# Patient Record
Sex: Male | Born: 1970 | Race: White | Hispanic: No | Marital: Married | Smoking: Current every day smoker
Health system: Southern US, Community
[De-identification: ages and names within clinical notes are randomized; demographics above are authoritative.]

## PROBLEM LIST (undated history)

## (undated) DIAGNOSIS — D649 Anemia, unspecified: Secondary | ICD-10-CM

## (undated) DIAGNOSIS — I82409 Acute embolism and thrombosis of unspecified deep veins of unspecified lower extremity: Secondary | ICD-10-CM

## (undated) DIAGNOSIS — E079 Disorder of thyroid, unspecified: Secondary | ICD-10-CM

## (undated) DIAGNOSIS — M1990 Unspecified osteoarthritis, unspecified site: Secondary | ICD-10-CM

## (undated) DIAGNOSIS — M26629 Arthralgia of temporomandibular joint, unspecified side: Secondary | ICD-10-CM

## (undated) HISTORY — PX: BILATERAL TEMPOROMANDIBULAR JOINT ARTHROPLASTY: SUR77

## (undated) HISTORY — PX: ARTHROSCOPIC REPAIR ACL: SUR80

## (undated) HISTORY — DX: Disorder of thyroid, unspecified: E07.9

## (undated) HISTORY — DX: Unspecified osteoarthritis, unspecified site: M19.90

---

## 2005-11-11 ENCOUNTER — Emergency Department (HOSPITAL_COMMUNITY): Admission: EM | Admit: 2005-11-11 | Discharge: 2005-11-11 | Payer: Self-pay | Admitting: Emergency Medicine

## 2007-03-13 ENCOUNTER — Emergency Department (HOSPITAL_COMMUNITY): Admission: EM | Admit: 2007-03-13 | Discharge: 2007-03-13 | Payer: Self-pay | Admitting: Emergency Medicine

## 2007-10-10 ENCOUNTER — Emergency Department (HOSPITAL_COMMUNITY): Admission: EM | Admit: 2007-10-10 | Discharge: 2007-10-10 | Payer: Self-pay | Admitting: Family Medicine

## 2008-05-06 ENCOUNTER — Emergency Department (HOSPITAL_COMMUNITY): Admission: EM | Admit: 2008-05-06 | Discharge: 2008-05-06 | Payer: Self-pay | Admitting: Emergency Medicine

## 2009-09-14 ENCOUNTER — Emergency Department (HOSPITAL_COMMUNITY): Admission: EM | Admit: 2009-09-14 | Discharge: 2009-09-14 | Payer: Self-pay | Admitting: Family Medicine

## 2009-09-18 ENCOUNTER — Observation Stay (HOSPITAL_COMMUNITY): Admission: EM | Admit: 2009-09-18 | Discharge: 2009-09-18 | Payer: Self-pay | Admitting: Emergency Medicine

## 2010-02-25 ENCOUNTER — Emergency Department (HOSPITAL_COMMUNITY): Admission: EM | Admit: 2010-02-25 | Discharge: 2010-02-25 | Payer: Self-pay | Admitting: Family Medicine

## 2010-08-12 ENCOUNTER — Emergency Department (HOSPITAL_COMMUNITY)
Admission: EM | Admit: 2010-08-12 | Discharge: 2010-08-12 | Payer: Self-pay | Source: Home / Self Care | Admitting: Family Medicine

## 2010-09-07 ENCOUNTER — Other Ambulatory Visit (HOSPITAL_COMMUNITY): Payer: Self-pay | Admitting: Orthopaedic Surgery

## 2010-09-07 DIAGNOSIS — S83519A Sprain of anterior cruciate ligament of unspecified knee, initial encounter: Secondary | ICD-10-CM

## 2010-09-07 DIAGNOSIS — M25561 Pain in right knee: Secondary | ICD-10-CM

## 2010-09-09 ENCOUNTER — Other Ambulatory Visit (HOSPITAL_COMMUNITY): Payer: Self-pay

## 2010-09-13 ENCOUNTER — Ambulatory Visit (HOSPITAL_COMMUNITY)
Admission: RE | Admit: 2010-09-13 | Discharge: 2010-09-13 | Disposition: A | Discharge status: Home / Self Care | Payer: 59 | Source: Ambulatory Visit | Attending: Orthopaedic Surgery | Admitting: Orthopaedic Surgery

## 2010-09-13 DIAGNOSIS — M2350 Chronic instability of knee, unspecified knee: Secondary | ICD-10-CM | POA: Insufficient documentation

## 2010-09-13 DIAGNOSIS — M1610 Unilateral primary osteoarthritis, unspecified hip: Secondary | ICD-10-CM | POA: Insufficient documentation

## 2010-09-13 DIAGNOSIS — M23329 Other meniscus derangements, posterior horn of medial meniscus, unspecified knee: Secondary | ICD-10-CM | POA: Insufficient documentation

## 2010-09-13 DIAGNOSIS — S83519A Sprain of anterior cruciate ligament of unspecified knee, initial encounter: Secondary | ICD-10-CM

## 2010-09-13 DIAGNOSIS — M169 Osteoarthritis of hip, unspecified: Secondary | ICD-10-CM | POA: Insufficient documentation

## 2010-09-13 DIAGNOSIS — M25561 Pain in right knee: Secondary | ICD-10-CM

## 2010-09-13 DIAGNOSIS — M25569 Pain in unspecified knee: Secondary | ICD-10-CM | POA: Insufficient documentation

## 2011-06-27 ENCOUNTER — Encounter: Payer: Self-pay | Admitting: Emergency Medicine

## 2011-06-27 ENCOUNTER — Emergency Department (INDEPENDENT_AMBULATORY_CARE_PROVIDER_SITE_OTHER)
Admission: EM | Admit: 2011-06-27 | Discharge: 2011-06-27 | Disposition: A | Discharge status: Home / Self Care | Payer: 59 | Source: Home / Self Care | Attending: Family Medicine | Admitting: Family Medicine

## 2011-06-27 DIAGNOSIS — J209 Acute bronchitis, unspecified: Secondary | ICD-10-CM

## 2011-06-27 MED ORDER — CEFUROXIME AXETIL 250 MG PO TABS: 250.0000 mg | ORAL_TABLET | Freq: Two times a day (BID) | ORAL | Status: AC

## 2011-06-27 NOTE — ED Notes (Signed)
Pt here with uri sx that started x 1 wk ago but has worsened with increased coughing spells with greenish phlegm,nasal and chest congestion and body aches.pt has tried otc cough meds but no relief.denies fevers

## 2011-06-27 NOTE — ED Provider Notes (Signed)
History     CSN: [id number] Arrival date & time: 06/27/2011  1:22 PM   First MD Initiated Contact with Patient 06/27/11 1306      Chief Complaint  Patient presents with  . URI  . Cough    (Consider location/radiation/quality/duration/timing/severity/associated sxs/prior treatment) Patient is a 40 y.o. male presenting with URI and cough. The history is provided by the patient.  URI The primary symptoms include cough and wheezing. Primary symptoms do not include fever, sore throat, nausea, vomiting or rash. The current episode started 6 to 7 days ago. This is a recurrent (smoker) problem. The problem has been gradually worsening.  Symptoms associated with the illness include congestion and rhinorrhea.  Cough Associated symptoms include rhinorrhea and wheezing. Pertinent negatives include no sore throat.    History reviewed. No pertinent past medical history.  History reviewed. No pertinent past surgical history.  History reviewed. No pertinent family history.  History  Substance Use Topics  . Smoking status: Current Everyday Smoker  . Smokeless tobacco: Not on file  . Alcohol Use: Yes      Review of Systems  Constitutional: Negative for fever.  HENT: Positive for congestion, rhinorrhea and postnasal drip. Negative for sore throat.   Respiratory: Positive for cough and wheezing.   Gastrointestinal: Negative for nausea and vomiting.  Skin: Negative for rash.    Allergies  Review of patient's allergies indicates no known allergies.  Home Medications  No current outpatient prescriptions on file.  BP 128/71  Pulse 99  Temp(Src) 98.2 F (36.8 C) (Oral)  Resp 16  SpO2 99%  Physical Exam  Nursing note and vitals reviewed. Constitutional: He appears well-developed and well-nourished.  HENT:  Head: Normocephalic.  Right Ear: External ear normal.  Left Ear: External ear normal.  Mouth/Throat: Oropharynx is clear and moist.  Eyes: Conjunctivae are normal. Pupils  are equal, round, and reactive to light.  Neck: Normal range of motion. Neck supple.  Cardiovascular: Normal rate, normal heart sounds and intact distal pulses.   Pulmonary/Chest: Effort normal and breath sounds normal. He has no wheezes. He has no rales.  Skin: Skin is warm and dry.    ED Course  Procedures (including critical care time)  Labs Reviewed - No data to display No results found.   1. Bronchitis, acute       MDM          Barkley Bruns, MD 06/27/11 [phone]

## 2011-08-28 ENCOUNTER — Encounter (HOSPITAL_COMMUNITY): Payer: Self-pay | Admitting: *Deleted

## 2011-08-28 ENCOUNTER — Emergency Department (INDEPENDENT_AMBULATORY_CARE_PROVIDER_SITE_OTHER)
Admission: EM | Admit: 2011-08-28 | Discharge: 2011-08-28 | Disposition: A | Discharge status: Home / Self Care | Payer: 59 | Source: Home / Self Care | Attending: Family Medicine | Admitting: Family Medicine

## 2011-08-28 ENCOUNTER — Emergency Department (INDEPENDENT_AMBULATORY_CARE_PROVIDER_SITE_OTHER): Payer: 59

## 2011-08-28 DIAGNOSIS — S92919A Unspecified fracture of unspecified toe(s), initial encounter for closed fracture: Secondary | ICD-10-CM

## 2011-08-28 DIAGNOSIS — S92911A Unspecified fracture of right toe(s), initial encounter for closed fracture: Secondary | ICD-10-CM

## 2011-08-28 HISTORY — DX: Arthralgia of temporomandibular joint, unspecified side: M26.629

## 2011-08-28 MED ORDER — IBUPROFEN 600 MG PO TABS: 600.0000 mg | ORAL_TABLET | Freq: Three times a day (TID) | ORAL | Status: AC

## 2011-08-28 MED ORDER — TRAMADOL HCL 50 MG PO TABS: 50.0000 mg | ORAL_TABLET | Freq: Four times a day (QID) | ORAL | Status: AC | PRN

## 2011-08-28 NOTE — ED Notes (Signed)
Per pt sheet of thick sheet rock fell across right foot x 10 days ago - pt had bruising and pain across top of foot and right 5th toe - bruising resolved pain has increased right fifth toe and lateral foot -

## 2011-08-29 NOTE — ED Provider Notes (Signed)
History     CSN: [id number]  Arrival date & time 08/28/11  1025   First MD Initiated Contact with Patient 08/28/11 1115      Chief Complaint  Patient presents with  . Foot Injury  . Foot Pain  . Toe Injury  . Toe Pain    (Consider location/radiation/quality/duration/timing/severity/associated sxs/prior treatment) HPI Comments: 41 y/o smoker male here c/o pain and swelling in right foot. 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 Had bruising at the tip of 5th toes that is improved bur still swollen and painful. Able to put weight and walk on right leg and foot but uncomfortable.    Past Medical History  Diagnosis Date  . TMJ syndrome     Past Surgical History  Procedure Date  . Bilateral temporomandibular joint arthroplasty   . Arthroscopic repair acl     History reviewed. No pertinent family history.  History  Substance Use Topics  . Smoking status: Current Everyday Smoker  . Smokeless tobacco: Not on file  . Alcohol Use: Yes      Review of Systems  All other systems reviewed and are negative.    Allergies  Review of patient's allergies indicates no known allergies.  Home Medications   Current Outpatient Rx  Name Route Sig Dispense Refill  . IBUPROFEN 600 MG PO TABS Oral Take 1 tablet (600 mg total) by mouth 3 (three) times daily. 30 tablet 0    Take with food  . TRAMADOL HCL 50 MG PO TABS Oral Take 1 tablet (50 mg total) by mouth every 6 (six) hours as needed for pain. 20 tablet 0    BP 126/71  Pulse 62  Temp(Src) 98.3 F (36.8 C) (Oral)  Resp 18  SpO2 97%  Physical Exam  Nursing note and vitals reviewed. Constitutional: He is oriented to person, place, and time. He appears well-developed and well-nourished. No distress.  Cardiovascular: Normal heart sounds.   Pulmonary/Chest: Breath sounds normal.  Musculoskeletal:       Right foot : swelling and ttp of  the 5th toe pain worse with movement of the toe. No bruising or skin brakes. No obvious deformity at the metatarsal area. Reported diffused pain over 5th metatarsus.  Right foot is neurovascularly intact.   Neurological: He is alert and oriented to person, place, and time.    ED Course  Procedures (including critical care time)  Labs Reviewed - No data to display Dg Foot Complete Right  08/28/2011  *RADIOLOGY REPORT*  Clinical Data: Foot injury, toe pain  RIGHT FOOT COMPLETE - 3+ VIEW  Comparison: None.  Findings: Three views of the right foot submitted.  There is minimal displaced fracture distal aspect of proximal phalanx right fifth toe.  Soft tissue swelling noted right fifth toe.  IMPRESSION:  There is minimal displaced fracture distal aspect of proximal phalanx right fifth toe.  Soft tissue swelling noted right fifth toe.  Original Report Authenticated By: Natasha Mead, M.D.     1. Toe fracture, right       MDM  Placed on rigid shoe. NSAIDSs ortho referral as needed.         Sharin Grave, MD 08/29/11 [phone]

## 2011-10-11 ENCOUNTER — Emergency Department (HOSPITAL_COMMUNITY)
Admission: EM | Admit: 2011-10-11 | Discharge: 2011-10-11 | Disposition: A | Discharge status: Home / Self Care | Payer: 59 | Attending: Emergency Medicine | Admitting: Emergency Medicine

## 2011-10-11 ENCOUNTER — Emergency Department (HOSPITAL_COMMUNITY): Payer: 59

## 2011-10-11 ENCOUNTER — Encounter (HOSPITAL_COMMUNITY): Payer: Self-pay | Admitting: Emergency Medicine

## 2011-10-11 DIAGNOSIS — S4980XA Other specified injuries of shoulder and upper arm, unspecified arm, initial encounter: Secondary | ICD-10-CM | POA: Insufficient documentation

## 2011-10-11 DIAGNOSIS — S46909A Unspecified injury of unspecified muscle, fascia and tendon at shoulder and upper arm level, unspecified arm, initial encounter: Secondary | ICD-10-CM | POA: Insufficient documentation

## 2011-10-11 DIAGNOSIS — W19XXXA Unspecified fall, initial encounter: Secondary | ICD-10-CM | POA: Insufficient documentation

## 2011-10-11 DIAGNOSIS — S46009A Unspecified injury of muscle(s) and tendon(s) of the rotator cuff of unspecified shoulder, initial encounter: Secondary | ICD-10-CM

## 2011-10-11 DIAGNOSIS — M25519 Pain in unspecified shoulder: Secondary | ICD-10-CM | POA: Insufficient documentation

## 2011-10-11 MED ORDER — HYDROMORPHONE HCL PF 1 MG/ML IJ SOLN
1.0000 mg | Freq: Once | INTRAMUSCULAR | Status: AC
  Administered 2011-10-11: 1 mg via INTRAMUSCULAR
  Filled 2011-10-11: qty 1

## 2011-10-11 MED ORDER — OXYCODONE-ACETAMINOPHEN 5-325 MG PO TABS: 2.0000 | ORAL_TABLET | ORAL | Status: AC | PRN

## 2011-10-11 NOTE — Progress Notes (Signed)
Orthopedic Tech Progress Note Patient Details:  David Shelton 06-Nov-1970 [id number]  Other Ortho Devices Type of Ortho Device: Other (comment) Ortho Device Location: left arm Ortho Device Interventions: Application   Marek Nghiem T 10/11/2011, 7:42 AM

## 2011-10-11 NOTE — ED Notes (Signed)
PT. TRIPPED AND FELL LAST Friday , REPORTS PROGRESSING PAIN / SWELLING AT LEFT SHOULDER.

## 2011-10-11 NOTE — ED Notes (Signed)
Fri the pt had too much to drink and had an un-witnessed fall.  He has been unable to move his L arm since then.  Radial and ulnar pulses present.  Slight discoloration to L shoulder.  Pain on any movement.

## 2011-10-11 NOTE — Discharge Instructions (Signed)
Your x-ray does not show any broken bones or dislocations.  Use a sling for support and Percocet for pain.  Follow up with your orthopedist for reevaluation.  Return for worse or uncontrolled symptoms

## 2011-10-11 NOTE — ED Provider Notes (Signed)
History     CSN: [id number]  Arrival date & time 10/11/11  0544   First MD Initiated Contact with Patient 10/11/11 [phone]      Chief Complaint  Patient presents with  . Shoulder Pain    (Consider location/radiation/quality/duration/timing/severity/associated sxs/prior treatment) Patient is a 41 y.o. male presenting with shoulder pain. The history is provided by the patient.  Shoulder Pain   the patient is a 41 year old, right-hand-dominant male, who complains of left shoulder pain since he fell onto his left shoulder, approximately 4 days ago.  He states that he is unable to raise his shoulder because of pain.  He has no other injuries.  No other complaints.  Past Medical History  Diagnosis Date  . TMJ syndrome     Past Surgical History  Procedure Date  . Bilateral temporomandibular joint arthroplasty   . Arthroscopic repair acl     No family history on file.  History  Substance Use Topics  . Smoking status: Current Everyday Smoker  . Smokeless tobacco: Not on file  . Alcohol Use: Yes      Review of Systems  Musculoskeletal:       Left shoulder pain  Neurological: Negative for weakness and numbness.  Hematological: Does not bruise/bleed easily.  All other systems reviewed and are negative.    Allergies  Review of patient's allergies indicates no known allergies.  Home Medications   Current Outpatient Rx  Name Route Sig Dispense Refill  . NAPROXEN SODIUM 220 MG PO TABS Oral Take 660 mg by mouth 2 (two) times daily with a meal. For pain.      BP 123/76  Pulse 95  Temp(Src) 98.3 F (36.8 C) (Oral)  Resp 19  SpO2 98%  Physical Exam  Vitals reviewed. Constitutional: He is oriented to person, place, and time. He appears well-developed and well-nourished.  HENT:  Head: Normocephalic and atraumatic.  Eyes: Conjunctivae and EOM are normal.  Neck: Normal range of motion.  Pulmonary/Chest: Effort normal. No respiratory distress.  Abdominal: He exhibits  no distension.  Musculoskeletal: He exhibits tenderness. He exhibits no edema.       Left shoulder No deformity.  No ecchymoses.  No visible swelling Tenderness over the mid clavicle And deltoid muscles Patient is unable to raise his left arm or abducting it do to severe pain.  Neurological: He is alert and oriented to person, place, and time.  Skin: Skin is dry.  Psychiatric: He has a normal mood and affect. Thought content normal.    ED Course  Procedures (including critical care time)  Labs Reviewed - No data to display Dg Shoulder Left  10/11/2011  *RADIOLOGY REPORT*  Clinical Data: Worsening left shoulder pain, 4 days status post fall.  LEFT SHOULDER - 2+ VIEW  Comparison: None.  Findings: There is no evidence of fracture or dislocation.  The left humeral head is seated within the glenoid fossa.  The acromioclavicular joint is unremarkable in appearance.  No significant soft tissue abnormalities are seen.  The visualized portions of the left lung are clear.  IMPRESSION: No evidence of fracture or dislocation.  Original Report Authenticated By: Tonia Ghent, M.D.     No diagnosis found.    MDM  Suspect a rotator cuff tear on the left side Patient already has an orthopedist with whom he will followup        Cheri Guppy, MD 10/11/11 [phone]

## 2012-05-11 ENCOUNTER — Encounter (HOSPITAL_COMMUNITY): Payer: Self-pay | Admitting: Emergency Medicine

## 2012-05-11 ENCOUNTER — Emergency Department (INDEPENDENT_AMBULATORY_CARE_PROVIDER_SITE_OTHER)
Admission: EM | Admit: 2012-05-11 | Discharge: 2012-05-11 | Disposition: A | Discharge status: Home / Self Care | Payer: 59 | Source: Home / Self Care | Attending: Emergency Medicine | Admitting: Emergency Medicine

## 2012-05-11 DIAGNOSIS — M7989 Other specified soft tissue disorders: Secondary | ICD-10-CM

## 2012-05-11 DIAGNOSIS — M79669 Pain in unspecified lower leg: Secondary | ICD-10-CM

## 2012-05-11 DIAGNOSIS — M79609 Pain in unspecified limb: Secondary | ICD-10-CM

## 2012-05-11 LAB — POCT I-STAT, CHEM 8
Creatinine, Ser: 1 mg/dL (ref 0.50–1.35)
Sodium: 140 mEq/L (ref 135–145)
TCO2: 29 mmol/L (ref 0–100)

## 2012-05-11 LAB — D-DIMER, QUANTITATIVE: D-Dimer, Quant: 1.51 ug/mL-FEU — ABNORMAL HIGH (ref 0.00–0.48)

## 2012-05-11 MED ORDER — ENOXAPARIN SODIUM 100 MG/ML ~~LOC~~ SOLN
95.0000 mg | Freq: Once | SUBCUTANEOUS | Status: AC
  Administered 2012-05-11: 95 mg via SUBCUTANEOUS
  Filled 2012-05-11: qty 1

## 2012-05-11 MED ORDER — WARFARIN SODIUM 10 MG PO TABS
10.0000 mg | ORAL_TABLET | Freq: Once | ORAL | Status: AC
  Administered 2012-05-11: 10 mg via ORAL
  Filled 2012-05-11: qty 1

## 2012-05-11 NOTE — ED Notes (Signed)
Dr. Ladon Applebaum said he already measured pt.'s calves.  L calf 15 " and R calf 17".

## 2012-05-11 NOTE — ED Provider Notes (Addendum)
History     CSN: [id number]  Arrival date & time 05/11/12  1636   First MD Initiated Contact with Patient 05/11/12 1637      Chief Complaint  Patient presents with  . Leg Swelling    (Consider location/radiation/quality/duration/timing/severity/associated sxs/prior treatment) HPI Comments: Patient presents urgent care this evening complaining of ongoing bilateral leg pain but most predominant on his right leg (calf region), he describes as Saturday he went on a very very long walk and he's not used to walk this long distances he thinks he might have walked/ran about 10-11 miles that day. He describes the swelling that is observed today as much less than what it has been since Saturday, but the pain has not improved and the swelling went down mainly on his left leg but not on his right calf area. It hurts when he walks or hyperextends in flexes his foot. He denies any numbness or tingling sensations and he denies any falls or direct trauma. He have not had any recent long trips by air or playing but does admit of having a motorcycle ride of about 4-5 hours about 2 weeks ago. Patient denies any history of DVTs or PEs or any blood dyscrasias. Patient is a smoker. Patient has had right knee surgery for and does express that from time to time he does see some swelling on his right lower extremity when he walks or performs certain activities but never as severe as this ocassion  Patient is a 41 y.o. male presenting with leg pain. The history is provided by the patient.  Leg Pain  The incident occurred 12 to 24 hours ago. The incident occurred at home. Injury mechanism: walking. The pain is present in the right leg. The pain is at a severity of 7/10. The pain is moderate. The pain has been constant since onset. Pertinent negatives include no numbness, no loss of motion, no muscle weakness, no loss of sensation and no tingling. He has tried nothing for the symptoms.    Past Medical History  Diagnosis  Date  . TMJ syndrome     Past Surgical History  Procedure Date  . Bilateral temporomandibular joint arthroplasty   . Arthroscopic repair acl     No family history on file.  History  Substance Use Topics  . Smoking status: Current Every Day Smoker  . Smokeless tobacco: Not on file  . Alcohol Use: Yes      Review of Systems  Constitutional: Positive for activity change. Negative for fever, chills and appetite change.  Respiratory: Negative for cough and wheezing.   Cardiovascular: Negative for chest pain and leg swelling.  Musculoskeletal: Negative for myalgias, back pain and joint swelling.  Skin: Negative for color change, rash and wound.  Neurological: Negative for dizziness, tingling and numbness.    Allergies  Review of patient's allergies indicates no known allergies.  Home Medications   Current Outpatient Rx  Name Route Sig Dispense Refill  . NAPROXEN SODIUM 220 MG PO TABS Oral Take 660 mg by mouth 2 (two) times daily with a meal. For pain.      BP 120/67  Pulse 68  Temp 97.9 F (36.6 C) (Oral)  Resp 17  SpO2 100%  Physical Exam  Nursing note and vitals reviewed. Constitutional: Vital signs are normal. He appears well-developed and well-nourished. He does not have a sickly appearance. He does not appear ill. No distress.  Abdominal: There is no tenderness.  Musculoskeletal: He exhibits tenderness.  Right knee: He exhibits normal range of motion, no effusion, no ecchymosis and no deformity.       Legs: Neurological: He is alert.  Skin: No rash noted. No erythema.    ED Course  Procedures (including critical care time)   Labs Reviewed  CK  D-DIMER, QUANTITATIVE   No results found.   No diagnosis found.    MDM  Modified Wells criteria  patient has a low probability risk. We are drawing a d-dimer to help decide whether or not we start patient on anticoagulation treatment tonight. We are calling the vascular lab and reporting patient  demographic information and requesting a vascular study for tomorrow am. DVT protocol system will be active during the weekend?. Patient will be advised to present to the emergency department tomorrow morning if he does not receive a phone call from the vascular lab in the AM.        Jimmie Molly, MD 05/11/12 1803  Jimmie Molly, MD 05/11/12 Rickey Primus  Jimmie Molly, MD 05/11/12 [phone]

## 2012-05-11 NOTE — ED Notes (Signed)
Reports swelling in right leg with pain since Saturday.  Elevated but no relief.

## 2012-05-11 NOTE — ED Notes (Signed)
Pharmacy called to send Lovenox dose. Pt.'s wt. 212 lbs.=95 mg.  Also ordered Coumadin 10 mg.  Courier sent.

## 2012-05-12 ENCOUNTER — Ambulatory Visit (HOSPITAL_COMMUNITY)
Admission: RE | Admit: 2012-05-12 | Discharge: 2012-05-12 | Disposition: A | Discharge status: Home / Self Care | Payer: 59 | Source: Ambulatory Visit | Attending: Emergency Medicine | Admitting: Emergency Medicine

## 2012-05-12 ENCOUNTER — Telehealth (HOSPITAL_COMMUNITY): Payer: Self-pay | Admitting: Emergency Medicine

## 2012-05-12 ENCOUNTER — Encounter (HOSPITAL_COMMUNITY): Payer: Self-pay | Admitting: Emergency Medicine

## 2012-05-12 ENCOUNTER — Emergency Department (INDEPENDENT_AMBULATORY_CARE_PROVIDER_SITE_OTHER)
Admission: EM | Admit: 2012-05-12 | Discharge: 2012-05-12 | Disposition: A | Discharge status: Home / Self Care | Payer: 59 | Source: Home / Self Care | Attending: Emergency Medicine | Admitting: Emergency Medicine

## 2012-05-12 DIAGNOSIS — M7989 Other specified soft tissue disorders: Secondary | ICD-10-CM | POA: Insufficient documentation

## 2012-05-12 DIAGNOSIS — M79609 Pain in unspecified limb: Secondary | ICD-10-CM

## 2012-05-12 DIAGNOSIS — I824Z9 Acute embolism and thrombosis of unspecified deep veins of unspecified distal lower extremity: Secondary | ICD-10-CM | POA: Insufficient documentation

## 2012-05-12 DIAGNOSIS — I82409 Acute embolism and thrombosis of unspecified deep veins of unspecified lower extremity: Secondary | ICD-10-CM

## 2012-05-12 LAB — CBC
HCT: 38.9 % — ABNORMAL LOW (ref 39.0–52.0)
Hemoglobin: 14.2 g/dL (ref 13.0–17.0)
MCH: 34.5 pg — ABNORMAL HIGH (ref 26.0–34.0)
MCHC: 36.5 g/dL — ABNORMAL HIGH (ref 30.0–36.0)
MCV: 94.4 fL (ref 78.0–100.0)
Platelets: 202 10*3/uL (ref 150–400)
RDW: 11.6 % (ref 11.5–15.5)

## 2012-05-12 MED ORDER — ENOXAPARIN SODIUM 100 MG/ML ~~LOC~~ SOLN
100.0000 mg | Freq: Once | SUBCUTANEOUS | Status: AC
  Administered 2012-05-12: 100 mg via SUBCUTANEOUS
  Filled 2012-05-12: qty 1

## 2012-05-12 MED ORDER — WARFARIN SODIUM 5 MG PO TABS: 5.0000 mg | ORAL_TABLET | Freq: Every day | ORAL | Status: DC

## 2012-05-12 MED ORDER — ENOXAPARIN SODIUM 100 MG/ML ~~LOC~~ SOLN: 100.0000 mg | Freq: Once | SUBCUTANEOUS | Status: DC

## 2012-05-12 NOTE — ED Provider Notes (Signed)
History     CSN: [id number]  Arrival date & time 05/12/12  1319   First MD Initiated Contact with Patient 05/12/12 1320      Chief Complaint  Patient presents with  . Follow-up    pt had a positive dvt test    (Consider location/radiation/quality/duration/timing/severity/associated sxs/prior treatment) HPI Comments: Patient presents urgent care this morning after having had a venous Doppler looks study as instructed to further discuss his results and establish a treatment and followup care plan. Patient continues to deny any chest pains, shortness of breath and describes some tenderness when he walks on his right leg. He also goes into describing to her staff that he was drinking last night after he departed our premises he had some beers.  The history is provided by the patient.    Past Medical History  Diagnosis Date  . TMJ syndrome     Past Surgical History  Procedure Date  . Bilateral temporomandibular joint arthroplasty   . Arthroscopic repair acl     History reviewed. No pertinent family history.  History  Substance Use Topics  . Smoking status: Current Every Day Smoker  . Smokeless tobacco: Not on file  . Alcohol Use: Yes      Review of Systems  Constitutional: Negative for fever, chills, activity change and fatigue.  Respiratory: Negative for chest tightness, shortness of breath and wheezing.   Cardiovascular: Negative for chest pain.  Skin: Negative for rash.  Neurological: Negative for dizziness.  Psychiatric/Behavioral: Negative for agitation.    Allergies  Review of patient's allergies indicates no known allergies.  Home Medications   Current Outpatient Rx  Name Route Sig Dispense Refill  . NAPROXEN SODIUM 220 MG PO TABS Oral Take 660 mg by mouth 2 (two) times daily with a meal. For pain.    . WARFARIN SODIUM 5 MG PO TABS Oral Take 1 tablet (5 mg total) by mouth daily. 5 tablet 0    BP 135/83  Pulse 78  Temp 97.8 F (36.6 C) (Oral)  Resp  18  SpO2 99%  Physical Exam  Constitutional: He appears well-developed and well-nourished. No distress.  Musculoskeletal: He exhibits tenderness.       Right lower leg: He exhibits tenderness.       Legs: Neurological: He is alert.  Skin: Skin is warm.    ED Course  Procedures (including critical care time)  Labs Reviewed  CBC - Abnormal; Notable for the following:    RBC 4.12 (*)     HCT 38.9 (*)     MCH 34.5 (*)     MCHC 36.5 (*)     All other components within normal limits  PROTIME-INR   No results found.   1. Deep vein thrombosis       MDM  Right lower extremity, peroneal thrombosis about 2 inches. Patient was provided with a second Lovenox dose and a prescription for Compazine and will start with 5 mg tonight. Today we have made her request electronically to establish patient with the [facility] for follow-up. Also encouraged patient to return in 3 days for INR recheck. I have discussed the venous Dopplers results with Dr. Hart Rochester from vascular and given location of thrombus in the deep venous system have recommended anticoagulation treatment as it has a tendency to progress to the popliteal venous.        Jimmie Molly, MD 05/12/12 [phone]

## 2012-05-12 NOTE — ED Notes (Signed)
Call from patient regarding his vascular appointment; referred to [facility]

## 2012-05-12 NOTE — ED Notes (Addendum)
Pt following up from visit last night.   Pt had positive dvt testing. Right calf.  Pt is c/o pain in calf with a burning sensation with walking.  Pt has not used any meds for pain relief.

## 2012-05-12 NOTE — ED Notes (Signed)
David Shelton from vascular called stating that patient has a DVT.  Dr Ladon Applebaum made aware.  Patient directed to return for discussion and management.

## 2012-05-12 NOTE — Progress Notes (Signed)
VASCULAR LAB PRELIMINARY  PRELIMINARY  PRELIMINARY  PRELIMINARY  Right lower extremity venous Doppler completed.    Preliminary report:  There is DVT noted in the peroneal vein from ankle, extending approximately 2 inches into calf.  All other vessels appear thrombus free.  Patrizia Paule, 05/12/2012, 1:13 PM

## 2012-05-12 NOTE — ED Notes (Signed)
Patient called today stating he has not received a call from vascular lab with his appointment time.  I spoke with Candice from vascular lab.  She stated she had not received order.  She gave me instruction on how to properly place order.  She was given patient's name and phone number.  I called patient back to make him aware and he stated Candice had already spoken with him and is scheduled for 12 today.  Dr Ladon Applebaum made aware

## 2012-05-14 ENCOUNTER — Telehealth: Payer: Self-pay | Admitting: Internal Medicine

## 2012-05-14 ENCOUNTER — Telehealth: Payer: Self-pay | Admitting: *Deleted

## 2012-05-14 ENCOUNTER — Telehealth (HOSPITAL_COMMUNITY): Payer: Self-pay | Admitting: Emergency Medicine

## 2012-05-14 NOTE — Telephone Encounter (Signed)
Received call for David Shelton  Family member or caregiver and she states that he was seen in ER Friday and Saturday and given coumadin Friday and Saturday as well as Lovenox injection on Friday and  Saturday and given prescription for coumadin and instructions to call and make an appt to be seen in coumadin clinic to have INR checked. Pt has DVT  right lower extremity. Pt does not have [facility] Doctor at this time and was instructed to call Primary Care  [phone] to be established with Morrill MD and then coumadin can be followed with [place] [facility]. Also instructed if unable to establish with Primary [facility] to take him  back to same urgent care for them to get MD to follow his INR and coumadin. Stressed to caregiver the importance of this pt being seen by MD and DVT being properly taken care of

## 2012-05-14 NOTE — Telephone Encounter (Signed)
Miranda from Urgent care called and this nurse informed  that he needed  Jarales MD for our clinic to follow  and she states she will take care of this.

## 2012-05-14 NOTE — Telephone Encounter (Signed)
The patient called and is hoping to be seen in the next couple days as a new pt.  He was diagnosed in the emergency dept with DVT and is hoping to follow up soon.  Do you want him worked into your schedule?  He has [employer] as insurance.   Thanks!

## 2012-05-14 NOTE — Telephone Encounter (Signed)
Pt scheduled for tomorrow am at 8:15.

## 2012-05-14 NOTE — ED Notes (Signed)
After reading notes in patient's chart and speaking with both Jasmine December and [place].  They are taking all available measures to have patient worked in and followed.  I attempted to call patient back but he did not answer.

## 2012-05-14 NOTE — Telephone Encounter (Signed)
sure

## 2012-05-15 ENCOUNTER — Ambulatory Visit (INDEPENDENT_AMBULATORY_CARE_PROVIDER_SITE_OTHER)
Admission: RE | Admit: 2012-05-15 | Discharge: 2012-05-15 | Disposition: A | Discharge status: Home / Self Care | Payer: 59 | Source: Ambulatory Visit | Attending: Internal Medicine | Admitting: Internal Medicine

## 2012-05-15 ENCOUNTER — Other Ambulatory Visit (INDEPENDENT_AMBULATORY_CARE_PROVIDER_SITE_OTHER): Payer: 59

## 2012-05-15 ENCOUNTER — Encounter: Payer: Self-pay | Admitting: Internal Medicine

## 2012-05-15 ENCOUNTER — Ambulatory Visit (INDEPENDENT_AMBULATORY_CARE_PROVIDER_SITE_OTHER): Payer: 59 | Admitting: Internal Medicine

## 2012-05-15 VITALS — BP 106/64 | HR 63 | Temp 98.4°F | Resp 16 | Ht 72.0 in | Wt 197.0 lb

## 2012-05-15 DIAGNOSIS — M19079 Primary osteoarthritis, unspecified ankle and foot: Secondary | ICD-10-CM

## 2012-05-15 DIAGNOSIS — R61 Generalized hyperhidrosis: Secondary | ICD-10-CM

## 2012-05-15 DIAGNOSIS — I82409 Acute embolism and thrombosis of unspecified deep veins of unspecified lower extremity: Secondary | ICD-10-CM

## 2012-05-15 LAB — CBC WITH DIFFERENTIAL/PLATELET
Basophils Absolute: 0 10*3/uL (ref 0.0–0.1)
Eosinophils Absolute: 0.2 10*3/uL (ref 0.0–0.7)
HCT: 45.1 % (ref 39.0–52.0)
Hemoglobin: 15.2 g/dL (ref 13.0–17.0)
Lymphs Abs: 1.4 10*3/uL (ref 0.7–4.0)
MCHC: 33.7 g/dL (ref 30.0–36.0)
Neutro Abs: 7.2 10*3/uL (ref 1.4–7.7)
Platelets: 264 10*3/uL (ref 150.0–400.0)
RDW: 12.7 % (ref 11.5–14.6)

## 2012-05-15 MED ORDER — OXYCODONE-ACETAMINOPHEN 10-325 MG PO TABS: 1.0000 | ORAL_TABLET | Freq: Three times a day (TID) | ORAL | Status: DC | PRN

## 2012-05-15 MED ORDER — RIVAROXABAN 15 MG PO TABS: 15.0000 mg | ORAL_TABLET | Freq: Every day | ORAL | Status: DC

## 2012-05-15 NOTE — Assessment & Plan Note (Signed)
His only risk factor is the long [employer] trips, I may consider a hyper-coagulopathy in the future but can't do it now b/c he has been on coumadin and lovenox, after a long d/w him and his wife about the pros and cons of different types of anti-coagulation he has decided to take Xarelto b/c his line of work will not allow him to come in for INR checks.

## 2012-05-15 NOTE — Patient Instructions (Signed)
Deep Vein Thrombosis  A deep vein thrombosis (DVT) is a blood clot that develops in a deep vein. A DVT is a clot in the deep, larger veins of the leg, arm, or pelvis. These are more dangerous than clots that might form in veins near the surface of the body. A DVT can lead to complications if the clot breaks off and travels in the bloodstream to the lungs.   A DVT can damage the valves in your leg veins, so that instead of flowing upwards, the blood pools in the lower leg. This is called post-thrombotic syndrome, and can result in pain, swelling, discoloration, and sores on the leg.  Once identified, a DVT can be treated. It can also be prevented in some circumstances. Once you have had a DVT, you may be at increased risk for a DVT in the future.  CAUSES  Blood clots form in a vein for different reasons. Usually several things contribute to blood clots. Contributing factors include:   The flow of blood slows down.   The inside of the vein is damaged in some way.   The person has a condition that makes blood clot more easily.  Some people are more likely than others to develop blood clots. That is because they have more factors that make clots likely. These are called risk factors. Risk factors include:    Older age, especially over 75 years old.   Having a history of blood clots. This means you have had one before. Or, it means that someone else in your family has had blood clots. You may have a genetic tendency to form clots.   Having major or lengthy surgery. This is especially true for surgery on the hip, knee, or belly (abdomen). Hip surgery is particularly high risk.   Breaking a hip or leg.   Sitting or lying still for a long time. This includes long distance travel, paralysis, or recovery from an illness or surgery.   Cancer, or cancer treatment.   Having a long, thin tube (catheter) placed inside a vein during a medical procedure.   Being overweight (obese).   Pregnancy and childbirth. Hormone  changes make the blood clot more easily during pregnancy. The fetus puts pressure on the veins of the pelvis. There is also risk of injury to veins during delivery or a caesarean. The risk is at its highest just after childbirth.   Medicines with the male hormone estrogen. This includes birth control pills and hormone replacement therapy.   Smoking.   Other circulation or heart problems.  SYMPTOMS  When a clot forms, it can either partially or totally block the blood flow in that vein. Symptoms of a DVT can include:   Swelling of the leg or arm, especially if one side is much worse.   Warmth and redness of the leg or arm, especially if one side is much worse.   Pain in an arm or leg. If the clot is in the leg, symptoms may be more noticeable or worse when standing or walking.  The symptoms of a DVT that has traveled to the lungs (pulmonary embolism, PE) usually start suddenly, and include:   Shortness of breath.   Coughing.   Coughing up blood or blood-tinged phlegm.   Chest pain. The chest pain is often worse with deep breaths.   Rapid heartbeat.  Anyone with these symptoms should get emergency medical treatment right away. Call your local emergency services 911 in [place] if you have these symptoms.    the clotting properties of the blood.  Ultrasonography to see if you have clots in your legs or lungs.  X-rays to show the flow of blood when dye is injected into the veins (venography).  Studies of your lungs, if you have any chest symptoms. PREVENTION  Exercise the legs regularly. Take a brisk 30 minute walk every day.  Maintain a weight that is appropriate for your height.  Avoid sitting or lying in bed for long periods of time without moving your legs.  Women, particularly those over the age of 35,  should consider the risks and benefits of taking estrogen medicines, including birth control pills.  Do not smoke, especially if you take estrogen medicines.  Long distance travel can increase your risk of DVT. You should exercise your legs by walking or pumping the muscles every hour.  In-hospital prevention:  Many of the risk factors above relate to situations that exist with hospitalization, either for illness, injury, or elective surgery.  Your caregiver will assess you for the need for venous thromboembolism prophylaxis when you are admitted to the hospital. If you are having surgery, your surgeon will assess you the day of or day after surgery.  Prevention may include medical and nonmedical measures. TREATMENT Treatment for DVT helps prevent death and disability. The most common treatment for DVT is blood thinning (anticoagulant) medicine, which reduces the blood's tendency to clot. Anticoagulants can stop new blood clots from forming and old ones from growing. They cannot dissolve existing clots. Your body does this by itself over time. Anticoagulants can be given by mouth, by intravenous (IV) access, or by injection. Your caregiver will determine the best program for you.  Heparin or related medicines (low molecular weight heparin) are usually the first treatment for a blood clot. They act quickly. However, they cannot be taken orally.  Heparin can cause a fall in a component of blood that stops bleeding and forms blood clots (platelets). You will be monitored with blood tests to be sure this does not occur.  Warfarin is an anticoagulant that can be swallowed (taken orally). It takes a few days to start working, so usually heparin or related medicines are used in combination. Once warfarin is working, heparin is usually stopped.  Less commonly, clot dissolving drugs (thrombolytics) are used to dissolve a DVT. They carry a high risk of bleeding, so they are used mainly in severe cases,  where a life or limb is threatened.  Very rarely, a blood clot in the leg needs to be removed surgically.  If you are unable to take anticoagulants, your caregiver may arrange for you to have a filter placed in a main vein in your belly (abdomen). This filter prevents clots from traveling to your lungs. HOME CARE INSTRUCTIONS  Take all medicines prescribed by your caregiver. Follow the directions carefully.  Warfarin. Most people will continue taking warfarin after hospital discharge. Your caregiver will advise you on the length of treatment (usually 3 6 months, sometimes lifelong).  Too much and too little warfarin are both dangerous. Too much warfarin increases the risk of bleeding. Too little warfarin continues to allow the risk for blood clots. While taking warfarin, you will need to have regular blood tests to measure your blood clotting time. These blood tests usually include both the prothrombin time (PT) and international normalized ratio (INR) tests. The PT and INR results allow your caregiver to adjust your dose of warfarin. The dose can change for many reasons. It is critically important that   blood clots. While taking warfarin, you will need to have regular blood tests to measure your blood clotting time. These blood tests usually include both the prothrombin time (PT) and international normalized ratio (INR) tests. The PT and INR results allow your caregiver to adjust your dose of warfarin. The dose can change for many reasons. It is critically important that you take warfarin exactly as prescribed, and that you have your PT and INR levels drawn exactly as directed.   Many foods, especially foods high in vitamin K can interfere with warfarin and affect the PT and INR results. Foods high in vitamin K include spinach, kale, broccoli, cabbage, collard and turnip greens, brussels sprouts, peas, cauliflower, seaweed, and parsley as well as beef and pork liver, green tea, and soybean oil. You should eat a consistent amount of foods high in vitamin K. Avoid major changes in your diet, or notify your caregiver before changing your diet. Arrange a visit with a dietitian to answer your questions.   Many medicines can interfere with warfarin and affect the PT and INR results. You must tell your caregiver about any and all medicines you take, this includes all vitamins  and supplements. Be especially cautious with aspirin and anti-inflammatory medicines. Ask your caregiver before taking these. Do not take or discontinue any prescribed or over-the-counter medicine except on the advice of your caregiver or pharmacist.   Warfarin can have side effects, primarily excessive bruising or bleeding. You will need to hold pressure over cuts for longer than usual. Your caregiver or pharmacist will discuss other potential side effects.   Alcohol can change the body's ability to handle warfarin. It is best to avoid alcoholic drinks or consume only very small amounts while taking warfarin. Notify your caregiver if you change your alcohol intake.   Notify your dentist or other caregivers before procedures.   Activity. Ask your caregiver how soon you can go back to normal activities. It is important to stay active to prevent blood clots. If you are on anticoagulant medicine, avoid contact sports.   Exercise. It is very important to exercise. This is especially important while traveling, sitting or standing for long periods of time. Exercise your legs by walking or by pumping the muscles frequently. Take frequent walks.   Compression stockings. These are tight elastic stockings that apply pressure to the lower legs. This pressure can help keep the blood in the legs from clotting. You may need to wear compressions stockings at home to help prevent a DVT.   Smoking. If you smoke, quit. Ask your caregiver for help with quitting smoking.   Learn as much as you can about DVT. Knowing more about the condition should help you keep it from coming back.   Wear a medical alert bracelet or carry a medical alert card.  SEEK MEDICAL CARE IF:   You notice a rapid heartbeat.   You feel weaker or more tired than usual.   You feel faint.   You notice increased bruising.   You feel your symptoms are not getting better in the time expected.   You believe you are having side effects of medicine.  SEEK  IMMEDIATE MEDICAL CARE IF:   You have chest pain.   You have trouble breathing.   You have new or increased swelling or pain in one leg.   You cough up blood.   You notice blood in vomit, in a bowel movement, or in urine.  MAKE SURE YOU:   Understand these instructions.  
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
## 2012-05-15 NOTE — Assessment & Plan Note (Signed)
CXR and CBC today

## 2012-05-15 NOTE — Assessment & Plan Note (Signed)
He will continue percocet as needed for pain 

## 2012-05-16 MED ORDER — RIVAROXABAN 15 MG PO TABS: 15.0000 mg | ORAL_TABLET | Freq: Two times a day (BID) | ORAL | Status: DC

## 2012-05-16 NOTE — Addendum Note (Signed)
Addended by: Etta Grandchild on: 05/16/2012 10:10 AM   Modules accepted: Orders

## 2012-06-07 ENCOUNTER — Encounter (HOSPITAL_COMMUNITY): Payer: Self-pay | Admitting: Physical Medicine and Rehabilitation

## 2012-06-07 ENCOUNTER — Telehealth: Payer: Self-pay

## 2012-06-07 ENCOUNTER — Emergency Department (HOSPITAL_COMMUNITY)
Admission: EM | Admit: 2012-06-07 | Discharge: 2012-06-07 | Disposition: A | Discharge status: Home / Self Care | Payer: 59 | Attending: Emergency Medicine | Admitting: Emergency Medicine

## 2012-06-07 ENCOUNTER — Emergency Department (HOSPITAL_COMMUNITY): Payer: 59

## 2012-06-07 ENCOUNTER — Other Ambulatory Visit: Payer: Self-pay

## 2012-06-07 DIAGNOSIS — R42 Dizziness and giddiness: Secondary | ICD-10-CM | POA: Insufficient documentation

## 2012-06-07 DIAGNOSIS — Z86718 Personal history of other venous thrombosis and embolism: Secondary | ICD-10-CM | POA: Insufficient documentation

## 2012-06-07 DIAGNOSIS — E079 Disorder of thyroid, unspecified: Secondary | ICD-10-CM | POA: Insufficient documentation

## 2012-06-07 DIAGNOSIS — Z862 Personal history of diseases of the blood and blood-forming organs and certain disorders involving the immune mechanism: Secondary | ICD-10-CM | POA: Insufficient documentation

## 2012-06-07 DIAGNOSIS — R0609 Other forms of dyspnea: Secondary | ICD-10-CM | POA: Insufficient documentation

## 2012-06-07 DIAGNOSIS — F17200 Nicotine dependence, unspecified, uncomplicated: Secondary | ICD-10-CM | POA: Insufficient documentation

## 2012-06-07 DIAGNOSIS — R0600 Dyspnea, unspecified: Secondary | ICD-10-CM

## 2012-06-07 DIAGNOSIS — M19079 Primary osteoarthritis, unspecified ankle and foot: Secondary | ICD-10-CM

## 2012-06-07 DIAGNOSIS — R0989 Other specified symptoms and signs involving the circulatory and respiratory systems: Secondary | ICD-10-CM | POA: Insufficient documentation

## 2012-06-07 DIAGNOSIS — R0602 Shortness of breath: Secondary | ICD-10-CM | POA: Insufficient documentation

## 2012-06-07 DIAGNOSIS — Z8739 Personal history of other diseases of the musculoskeletal system and connective tissue: Secondary | ICD-10-CM | POA: Insufficient documentation

## 2012-06-07 DIAGNOSIS — R079 Chest pain, unspecified: Secondary | ICD-10-CM

## 2012-06-07 HISTORY — DX: Acute embolism and thrombosis of unspecified deep veins of unspecified lower extremity: I82.409

## 2012-06-07 HISTORY — DX: Anemia, unspecified: D64.9

## 2012-06-07 LAB — CBC WITH DIFFERENTIAL/PLATELET
Basophils Absolute: 0 10*3/uL (ref 0.0–0.1)
Basophils Relative: 0 % (ref 0–1)
MCHC: 36.1 g/dL — ABNORMAL HIGH (ref 30.0–36.0)
Neutro Abs: 4.1 10*3/uL (ref 1.7–7.7)
Neutrophils Relative %: 63 % (ref 43–77)
Platelets: 201 10*3/uL (ref 150–400)
RDW: 12 % (ref 11.5–15.5)
WBC: 6.6 10*3/uL (ref 4.0–10.5)

## 2012-06-07 LAB — COMPREHENSIVE METABOLIC PANEL
ALT: 15 U/L (ref 0–53)
AST: 17 U/L (ref 0–37)
Albumin: 4 g/dL (ref 3.5–5.2)
Chloride: 102 mEq/L (ref 96–112)
Creatinine, Ser: 0.91 mg/dL (ref 0.50–1.35)
Potassium: 3.7 mEq/L (ref 3.5–5.1)
Sodium: 140 mEq/L (ref 135–145)
Total Bilirubin: 0.9 mg/dL (ref 0.3–1.2)

## 2012-06-07 LAB — POCT I-STAT TROPONIN I

## 2012-06-07 MED ORDER — HYDROMORPHONE HCL PF 1 MG/ML IJ SOLN
0.5000 mg | Freq: Once | INTRAMUSCULAR | Status: AC
  Administered 2012-06-07: 0.5 mg via INTRAVENOUS
  Filled 2012-06-07: qty 1

## 2012-06-07 MED ORDER — IOHEXOL 350 MG/ML SOLN
100.0000 mL | Freq: Once | INTRAVENOUS | Status: AC | PRN
  Administered 2012-06-07: 100 mL via INTRAVENOUS

## 2012-06-07 MED ORDER — OXYCODONE-ACETAMINOPHEN 10-325 MG PO TABS: 1.0000 | ORAL_TABLET | Freq: Three times a day (TID) | ORAL | Status: DC | PRN

## 2012-06-07 MED ORDER — SODIUM CHLORIDE 0.9 % IV BOLUS (SEPSIS)
1000.0000 mL | Freq: Once | INTRAVENOUS | Status: AC
  Administered 2012-06-07: 1000 mL via INTRAVENOUS

## 2012-06-07 MED ORDER — ALBUTEROL SULFATE HFA 108 (90 BASE) MCG/ACT IN AERS
2.0000 | INHALATION_SPRAY | Freq: Four times a day (QID) | RESPIRATORY_TRACT | Status: DC
  Administered 2012-06-07: 2 via RESPIRATORY_TRACT
  Filled 2012-06-07: qty 6.7

## 2012-06-07 NOTE — Telephone Encounter (Signed)
Patient called requesting refill for oxycodone, last filled 05/15/12 #[id number].

## 2012-06-07 NOTE — ED Notes (Signed)
Pt presents to department for evaluation of L sided chest pain. Intermittent x2 days. Pt states pain to R lower leg, states he has DVT and has been taking Xarelto. 5/10 chest pressure upon arrival. Also states SOB, especially on exertion. He is conscious alert and oriented x4. Skin warm and dry.

## 2012-06-07 NOTE — ED Provider Notes (Signed)
History     CSN: [id number]  Arrival date & time 06/07/12  1703   First MD Initiated Contact with Patient 06/07/12 1833      Chief Complaint  Patient presents with  . Chest Pain  . Shortness of Breath    (Consider location/radiation/quality/duration/timing/severity/associated sxs/prior treatment) HPI The patient presents with concerns of ongoing chest pain.  The pain is left sided, intermittent, severe, associated with lightheadedness and dyspnea.  There no clear exacerbating or alleviating factors.  He notes that the dyspnea is worse with exertion.  Notably, the patient was recently started on anticoagulants for a new right lower extremity DVT.  He states he has been compliant with his medication. Past Medical History  Diagnosis Date  . TMJ syndrome   . Arthritis   . Thyroid disease   . DVT (deep venous thrombosis)   . Anemia     Past Surgical History  Procedure Date  . Bilateral temporomandibular joint arthroplasty   . Arthroscopic repair acl     Family History  Problem Relation Age of Onset  . Arthritis Mother   . Hypertension Father   . Alcohol abuse Neg Hx   . Cancer Neg Hx   . Heart disease Neg Hx   . Hyperlipidemia Neg Hx   . Kidney disease Neg Hx   . Stroke Neg Hx   . Clotting disorder Neg Hx     History  Substance Use Topics  . Smoking status: Current Every Day Smoker    Types: Cigarettes  . Smokeless tobacco: Never Used  . Alcohol Use: 0.0 oz/week      Review of Systems  Constitutional:       Per HPI, otherwise negative  HENT:       Per HPI, otherwise negative  Eyes: Negative.   Respiratory:       Per HPI, otherwise negative  Cardiovascular:       Per HPI, otherwise negative  Gastrointestinal: Negative for vomiting.  Genitourinary: Negative.   Musculoskeletal:       Per HPI, otherwise negative  Skin: Negative.   Neurological: Negative for syncope.    Allergies  Review of patient's allergies indicates no known allergies.  Home  Medications   Current Outpatient Rx  Name  Route  Sig  Dispense  Refill  . OXYCODONE-ACETAMINOPHEN 10-325 MG PO TABS   Oral   Take 1 tablet by mouth 3 (three) times daily as needed. For pain         . RIVAROXABAN 15 MG PO TABS   Oral   Take 1 tablet (15 mg total) by mouth 2 (two) times daily.   60 tablet   0     BP 127/75  Pulse 72  Temp 97.7 F (36.5 C) (Oral)  Resp 14  SpO2 100%  Physical Exam  Nursing note and vitals reviewed. Constitutional: He is oriented to person, place, and time. He appears well-developed. No distress.  HENT:  Head: Normocephalic and atraumatic.  Eyes: Conjunctivae normal and EOM are normal.  Cardiovascular: Normal rate and regular rhythm.   Pulmonary/Chest: Effort normal. No stridor. No respiratory distress.  Abdominal: He exhibits no distension.  Musculoskeletal: He exhibits edema.  Neurological: He is alert and oriented to person, place, and time.  Skin: Skin is warm and dry.  Psychiatric: His mood appears anxious.    ED Course  Procedures (including critical care time)  Labs Reviewed  CBC WITH DIFFERENTIAL - Abnormal; Notable for the following:    MCH 34.4 (*)  MCHC 36.1 (*)     All other components within normal limits  COMPREHENSIVE METABOLIC PANEL - Abnormal; Notable for the following:    Glucose, Bld 139 (*)     All other components within normal limits  POCT I-STAT TROPONIN I   Dg Chest 2 View  06/07/2012  *RADIOLOGY REPORT*  Clinical Data: History of chest pain.  Shortness of breath.  2-week history of blood clot in his leg.  Episodes of dizziness.  CHEST - 2 VIEW  Comparison: 05/15/2012.  Findings: Cardiac silhouette is upper limits of normal size. Mediastinal and hilar contours appear stable.  Lungs are free of infiltrates.  No pneumothorax or pleural effusion is seen. Bones appear average for age.  IMPRESSION: No acute or active cardiopulmonary or pleural abnormalities are evident.   Original Report Authenticated By:  Onalee Hua Call      No diagnosis found.   9:29 PM Patient significantly more comfortable.  VSS  Cardiac: 65sr, normal  O2- 99%ra, normal   Date: 06/07/2012  Rate: 71  Rhythm: normal sinus rhythm  QRS Axis: left  Intervals: normal  ST/T Wave abnormalities: nonspecific T wave changes  Conduction Disutrbances:right bundle branch block  Narrative Interpretation:   Old EKG Reviewed: none available ABNORMAL  MDM  The patient presents with chest pain, dyspnea.  Notably, the patient has a recent diagnosis of DVT for which he states he is taking his Xaralto.  Given the patient's description of extreme discomfort, diaphoresis, dyspnea, chest pain, has been out of previous hypercoagulability workup, there is some suspicion of PE, in spite of his using anticoagulants.  ACS was considered, though less likely given his absence of other risk factors beyond smoking.  The patient's labs were largely reassuring, his CAT scan did not demonstrate new PE.  Following interventions the patient was essentially more comfortable.  Absent new acute findings, the patient was discharged with instructions to follow up with primary care physician.  This episode may be due to bronchitis or viral URI, though additional evaluation is required.  Discussed all findings with the patient and his wife.  He is discharged in stable condition with return precautions, follow up instructions.        Gerhard Munch, MD 06/07/12 2132

## 2012-06-07 NOTE — ED Notes (Signed)
MD at bedside. [place]  

## 2012-06-07 NOTE — Telephone Encounter (Signed)
done

## 2012-06-08 NOTE — Telephone Encounter (Signed)
Patient notified to pick up rx

## 2012-06-12 ENCOUNTER — Ambulatory Visit: Payer: 59 | Admitting: Internal Medicine

## 2012-06-12 DIAGNOSIS — Z0289 Encounter for other administrative examinations: Secondary | ICD-10-CM

## 2012-06-28 ENCOUNTER — Other Ambulatory Visit: Payer: Self-pay | Admitting: Internal Medicine

## 2012-06-28 DIAGNOSIS — I82409 Acute embolism and thrombosis of unspecified deep veins of unspecified lower extremity: Secondary | ICD-10-CM

## 2012-06-28 MED ORDER — RIVAROXABAN 15 MG PO TABS: 15.0000 mg | ORAL_TABLET | Freq: Every day | ORAL | Status: DC

## 2012-06-29 ENCOUNTER — Other Ambulatory Visit: Payer: Self-pay | Admitting: Internal Medicine

## 2012-06-29 DIAGNOSIS — I82409 Acute embolism and thrombosis of unspecified deep veins of unspecified lower extremity: Secondary | ICD-10-CM

## 2012-06-29 MED ORDER — RIVAROXABAN 20 MG PO TABS: 20.0000 mg | ORAL_TABLET | Freq: Every day | ORAL | Status: DC

## 2012-07-03 ENCOUNTER — Encounter: Payer: Self-pay | Admitting: Internal Medicine

## 2012-07-03 ENCOUNTER — Ambulatory Visit (INDEPENDENT_AMBULATORY_CARE_PROVIDER_SITE_OTHER): Payer: 59 | Admitting: Internal Medicine

## 2012-07-03 VITALS — BP 140/76 | Temp 98.2°F | Wt 199.0 lb

## 2012-07-03 DIAGNOSIS — Z23 Encounter for immunization: Secondary | ICD-10-CM

## 2012-07-03 DIAGNOSIS — J069 Acute upper respiratory infection, unspecified: Secondary | ICD-10-CM | POA: Insufficient documentation

## 2012-07-03 DIAGNOSIS — I82409 Acute embolism and thrombosis of unspecified deep veins of unspecified lower extremity: Secondary | ICD-10-CM

## 2012-07-03 DIAGNOSIS — M19079 Primary osteoarthritis, unspecified ankle and foot: Secondary | ICD-10-CM

## 2012-07-03 DIAGNOSIS — R51 Headache: Secondary | ICD-10-CM

## 2012-07-03 DIAGNOSIS — R519 Headache, unspecified: Secondary | ICD-10-CM

## 2012-07-03 DIAGNOSIS — G43909 Migraine, unspecified, not intractable, without status migrainosus: Secondary | ICD-10-CM

## 2012-07-03 MED ORDER — AMITRIPTYLINE HCL 10 MG PO TABS: 10.0000 mg | ORAL_TABLET | Freq: Every day | ORAL | Status: DC

## 2012-07-03 MED ORDER — OXYCODONE-ACETAMINOPHEN 10-325 MG PO TABS: 1.0000 | ORAL_TABLET | Freq: Three times a day (TID) | ORAL | Status: DC | PRN

## 2012-07-03 NOTE — Assessment & Plan Note (Signed)
Will continue percocet as needed

## 2012-07-03 NOTE — Assessment & Plan Note (Signed)
He has a new type of HA so I have asked him to get an MRI done to see if he has had a bleed, mass, etc

## 2012-07-03 NOTE — Assessment & Plan Note (Signed)
Improvement noted on xarelto

## 2012-07-03 NOTE — Progress Notes (Signed)
Subjective:    Patient ID: David Shelton, male    DOB: Sep 29, 1970, 41 y.o.   MRN: [id number]  Headache  This is a new problem. Episode onset: 3 weeks ago. The problem occurs intermittently. The problem has been gradually improving. The pain is located in the left unilateral and parietal region. The pain does not radiate. The pain quality is not similar to prior headaches. The quality of the pain is described as sharp and boring. The pain is at a severity of 3/10. The pain is mild. Associated symptoms include blurred vision, coughing and a visual change. Pertinent negatives include no abdominal pain, abnormal behavior, anorexia, back pain, dizziness, drainage, ear pain, eye pain, eye redness, eye watering, facial sweating, fever, hearing loss, insomnia, loss of balance, muscle aches, nausea, neck pain, numbness, phonophobia, photophobia, rhinorrhea, scalp tenderness, seizures, sinus pressure, sore throat, swollen glands, tingling, tinnitus, vomiting, weakness or weight loss. The symptoms are aggravated by emotional stress. He has tried oral narcotics for the symptoms. The treatment provided significant relief. His past medical history is significant for migraine headaches and migraines in the family.  Cough This is a new problem. The current episode started yesterday. The problem has been unchanged. The cough is non-productive. Associated symptoms include headaches. Pertinent negatives include no chest pain, chills, ear congestion, ear pain, eye redness, fever, heartburn, hemoptysis, myalgias, nasal congestion, postnasal drip, rash, rhinorrhea, sore throat, shortness of breath, sweats, weight loss or wheezing. Nothing aggravates the symptoms. He has tried nothing for the symptoms.      Review of Systems  Constitutional: Negative for fever, chills, weight loss, diaphoresis, activity change, appetite change, fatigue and unexpected weight change.  HENT: Negative for hearing loss, ear pain, sore throat,  rhinorrhea, neck pain, postnasal drip, sinus pressure and tinnitus.   Eyes: Positive for blurred vision. Negative for photophobia, pain, discharge, redness, itching and visual disturbance.  Respiratory: Positive for cough. Negative for apnea, hemoptysis, choking, chest tightness, shortness of breath, wheezing and stridor.   Cardiovascular: Negative for chest pain, palpitations and leg swelling.  Gastrointestinal: Negative for heartburn, nausea, vomiting, abdominal pain, diarrhea, constipation, blood in stool, abdominal distention and anorexia.  Genitourinary: Negative.   Musculoskeletal: Positive for arthralgias (right ankle). Negative for myalgias, back pain, joint swelling and gait problem.  Skin: Negative for color change, pallor, rash and wound.  Neurological: Positive for headaches. Negative for dizziness, tingling, tremors, seizures, syncope, facial asymmetry, speech difficulty, weakness, light-headedness, numbness and loss of balance.  Hematological: Negative for adenopathy. Does not bruise/bleed easily.  Psychiatric/Behavioral: Negative.  The patient does not have insomnia.        Objective:   Physical Exam  Vitals reviewed. Constitutional: He is oriented to person, place, and time. He appears well-developed and well-nourished.  Non-toxic appearance. He does not have a sickly appearance. He does not appear ill. No distress.  HENT:  Head: Normocephalic and atraumatic.  Right Ear: External ear normal.  Left Ear: External ear normal.  Nose: Nose normal.  Mouth/Throat: Oropharynx is clear and moist. No oropharyngeal exudate.  Eyes: Conjunctivae normal and EOM are normal. Pupils are equal, round, and reactive to light. Right eye exhibits no discharge. Left eye exhibits no discharge. No scleral icterus.  Neck: Normal range of motion. Neck supple. No JVD present. No tracheal deviation present. No thyromegaly present.  Cardiovascular: Normal rate, regular rhythm and intact distal pulses.   Exam reveals no gallop and no friction rub.   No murmur heard. Pulmonary/Chest: Effort normal and breath sounds normal.  No stridor. No respiratory distress. He has no wheezes. He has no rales. He exhibits no tenderness.  Abdominal: Soft. Bowel sounds are normal. He exhibits no distension and no mass. There is no tenderness. There is no rebound and no guarding.  Musculoskeletal: Normal range of motion. He exhibits no edema and no tenderness.  Lymphadenopathy:    He has no cervical adenopathy.  Neurological: He is alert and oriented to person, place, and time. He has normal strength. He displays no atrophy, no tremor and normal reflexes. No cranial nerve deficit or sensory deficit. He exhibits normal muscle tone. He displays a negative Romberg sign. He displays no seizure activity. Coordination and gait normal. He displays no Babinski's sign on the right side. He displays no Babinski's sign on the left side.  Reflex Scores:      Tricep reflexes are 1+ on the right side and 1+ on the left side.      Bicep reflexes are 1+ on the right side and 1+ on the left side.      Brachioradialis reflexes are 1+ on the right side and 1+ on the left side.      Patellar reflexes are 1+ on the right side and 1+ on the left side.      Achilles reflexes are 1+ on the right side and 1+ on the left side. Skin: Skin is warm and dry. No rash noted. He is not diaphoretic. No erythema. No pallor.  Psychiatric: He has a normal mood and affect. His behavior is normal. Judgment and thought content normal.          Assessment & Plan:

## 2012-07-03 NOTE — Assessment & Plan Note (Signed)
He appears to have a viral URI, no treatment needed at this time He will let me know if he develops any new or worsening symptoms

## 2012-07-03 NOTE — Assessment & Plan Note (Signed)
He has responded well to this in the past so will restart it

## 2012-07-03 NOTE — Patient Instructions (Signed)
Migraine Headache A migraine headache is an intense, throbbing pain on one or both sides of your head. A migraine can last for 30 minutes to several hours. CAUSES  The exact cause of a migraine headache is not always known. However, a migraine may be caused when nerves in the brain become irritated and release chemicals that cause inflammation. This causes pain. SYMPTOMS  Pain on one or both sides of your head.  Pulsating or throbbing pain.  Severe pain that prevents daily activities.  Pain that is aggravated by any physical activity.  Nausea, vomiting, or both.  Dizziness.  Pain with exposure to bright lights, loud noises, or activity.  General sensitivity to bright lights, loud noises, or smells. Before you get a migraine, you may get warning signs that a migraine is coming (aura). An aura may include:  Seeing flashing lights.  Seeing bright spots, halos, or zig-zag lines.  Having tunnel vision or blurred vision.  Having feelings of numbness or tingling.  Having trouble talking.  Having muscle weakness. MIGRAINE TRIGGERS  Alcohol.  Smoking.  Stress.  Menstruation.  Aged cheeses.  Foods or drinks that contain nitrates, glutamate, aspartame, or tyramine.  Lack of sleep.  Chocolate.  Caffeine.  Hunger.  Physical exertion.  Fatigue.  Medicines used to treat chest pain (nitroglycerine), birth control pills, estrogen, and some blood pressure medicines. DIAGNOSIS  A migraine headache is often diagnosed based on:  Symptoms.  Physical examination.  A CT scan or MRI of your head. TREATMENT Medicines may be given for pain and nausea. Medicines can also be given to help prevent recurrent migraines.  HOME CARE INSTRUCTIONS  Only take over-the-counter or prescription medicines for pain or discomfort as directed by your caregiver. The use of long-term narcotics is not recommended.  Lie down in a dark, quiet room when you have a migraine.  Keep a journal  to find out what may trigger your migraine headaches. For example, write down:  What you eat and drink.  How much sleep you get.  Any change to your diet or medicines.  Limit alcohol consumption.  Quit smoking if you smoke.  Get 7 to 9 hours of sleep, or as recommended by your caregiver.  Limit stress.  Keep lights dim if bright lights bother you and make your migraines worse. SEEK IMMEDIATE MEDICAL CARE IF:   Your migraine becomes severe.  You have a fever.  You have a stiff neck.  You have vision loss.  You have muscular weakness or loss of muscle control.  You start losing your balance or have trouble walking.  You feel faint or pass out.  You have severe symptoms that are different from your first symptoms. MAKE SURE YOU:   Understand these instructions.  Will watch your condition.  Will get help right away if you are not doing well or get worse. Document Released: 07/04/2005 Document Revised: 09/26/2011 Document Reviewed: 06/24/2011 [facility] Patient Information 2013 [place], [place]. Upper Respiratory Infection, Adult An upper respiratory infection (URI) is also known as the common cold. It is often caused by a type of germ (virus). Colds are easily spread (contagious). You can pass it to others by kissing, coughing, sneezing, or drinking out of the same glass. Usually, you get better in 1 or 2 weeks.  HOME CARE   Only take medicine as told by your doctor.  Use a warm mist humidifier or breathe in steam from a hot shower.  Drink enough water and fluids to keep your pee (urine)  clear or pale yellow.  Get plenty of rest.  Return to work when your temperature is back to normal or as told by your doctor. You may use a face mask and wash your hands to stop your cold from spreading. GET HELP RIGHT AWAY IF:   After the first few days, you feel you are getting worse.  You have questions about your medicine.  You have chills, shortness of breath, or brown or  red spit (mucus).  You have yellow or brown snot (nasal discharge) or pain in the face, especially when you bend forward.  You have a fever, puffy (swollen) neck, pain when you swallow, or white spots in the back of your throat.  You have a bad headache, ear pain, sinus pain, or chest pain.  You have a high-pitched whistling sound when you breathe in and out (wheezing).  You have a lasting cough or cough up blood.  You have sore muscles or a stiff neck. MAKE SURE YOU:   Understand these instructions.  Will watch your condition.  Will get help right away if you are not doing well or get worse. Document Released: 12/21/2007 Document Revised: 09/26/2011 Document Reviewed: 11/08/2010 [facility] Patient Information 2013 [place], [place].

## 2012-07-24 ENCOUNTER — Ambulatory Visit (INDEPENDENT_AMBULATORY_CARE_PROVIDER_SITE_OTHER): Payer: 59 | Admitting: Internal Medicine

## 2012-07-24 ENCOUNTER — Ambulatory Visit (INDEPENDENT_AMBULATORY_CARE_PROVIDER_SITE_OTHER)
Admission: RE | Admit: 2012-07-24 | Discharge: 2012-07-24 | Disposition: A | Discharge status: Home / Self Care | Payer: 59 | Source: Ambulatory Visit | Attending: Internal Medicine | Admitting: Internal Medicine

## 2012-07-24 ENCOUNTER — Encounter: Payer: Self-pay | Admitting: Internal Medicine

## 2012-07-24 VITALS — BP 124/68 | HR 67 | Temp 97.8°F | Resp 16 | Wt 202.0 lb

## 2012-07-24 DIAGNOSIS — G43909 Migraine, unspecified, not intractable, without status migrainosus: Secondary | ICD-10-CM

## 2012-07-24 DIAGNOSIS — S4980XA Other specified injuries of shoulder and upper arm, unspecified arm, initial encounter: Secondary | ICD-10-CM

## 2012-07-24 DIAGNOSIS — S4992XA Unspecified injury of left shoulder and upper arm, initial encounter: Secondary | ICD-10-CM

## 2012-07-24 DIAGNOSIS — R519 Headache, unspecified: Secondary | ICD-10-CM

## 2012-07-24 DIAGNOSIS — R51 Headache: Secondary | ICD-10-CM

## 2012-07-24 DIAGNOSIS — M2180 Other specified acquired deformities of unspecified limb: Secondary | ICD-10-CM

## 2012-07-24 DIAGNOSIS — I82409 Acute embolism and thrombosis of unspecified deep veins of unspecified lower extremity: Secondary | ICD-10-CM

## 2012-07-24 DIAGNOSIS — M21929 Unspecified acquired deformity of unspecified upper arm: Secondary | ICD-10-CM

## 2012-07-24 DIAGNOSIS — M19079 Primary osteoarthritis, unspecified ankle and foot: Secondary | ICD-10-CM

## 2012-07-24 MED ORDER — OXYCODONE-ACETAMINOPHEN 10-325 MG PO TABS: 1.0000 | ORAL_TABLET | Freq: Three times a day (TID) | ORAL | Status: DC | PRN

## 2012-07-24 NOTE — Assessment & Plan Note (Signed)
Continue percocet as needed for pain

## 2012-07-24 NOTE — Progress Notes (Signed)
  Subjective:    Patient ID: David Shelton, male    DOB: March 31, 1971, 42 y.o.   MRN: [id number]  Shoulder Injury  The incident occurred at home. The left shoulder is affected. The incident occurred 5 to 7 days ago. The injury mechanism was a fall. The quality of the pain is described as aching. The pain does not radiate. The pain is at a severity of 2/10. The pain is mild. Pertinent negatives include no chest pain, muscle weakness, numbness or tingling. The symptoms are aggravated by overhead lifting and movement. He has tried acetaminophen for the symptoms. The treatment provided moderate relief.      Review of Systems  Constitutional: Negative.   HENT: Negative.   Eyes: Negative.   Respiratory: Negative for cough, chest tightness, shortness of breath, wheezing and stridor.   Cardiovascular: Negative for chest pain, palpitations and leg swelling.  Gastrointestinal: Negative for nausea, vomiting, abdominal pain, diarrhea, constipation and blood in stool.  Genitourinary: Negative.   Musculoskeletal: Positive for arthralgias. Negative for myalgias, back pain, joint swelling and gait problem.  Skin: Negative for color change, pallor, rash and wound.  Neurological: Negative.  Negative for dizziness, tingling, tremors, weakness, light-headedness and numbness.  Hematological: Negative for adenopathy. Does not bruise/bleed easily.  Psychiatric/Behavioral: Negative.        Objective:   Physical Exam  Vitals reviewed. Constitutional: He is oriented to person, place, and time. He appears well-developed and well-nourished. No distress.  HENT:  Head: Normocephalic and atraumatic.  Mouth/Throat: Oropharynx is clear and moist. No oropharyngeal exudate.  Eyes: Conjunctivae normal are normal. Right eye exhibits no discharge. Left eye exhibits no discharge. No scleral icterus.  Neck: Normal range of motion. Neck supple. No JVD present. No tracheal deviation present. No thyromegaly present.    Cardiovascular: Normal rate, regular rhythm, normal heart sounds and intact distal pulses.  Exam reveals no gallop and no friction rub.   No murmur heard. Pulmonary/Chest: Effort normal and breath sounds normal. No stridor. No respiratory distress. He has no wheezes. He has no rales. He exhibits no tenderness.  Abdominal: Soft. Bowel sounds are normal. He exhibits no distension and no mass. There is no tenderness. There is no rebound and no guarding.  Musculoskeletal: He exhibits no edema and no tenderness.       Left shoulder: He exhibits decreased range of motion. He exhibits no tenderness, no bony tenderness, no swelling, no effusion, no crepitus, no deformity, no laceration, no pain, no spasm, normal pulse and normal strength.  Lymphadenopathy:    He has no cervical adenopathy.  Neurological: He is oriented to person, place, and time.  Skin: Skin is warm and dry. No rash noted. He is not diaphoretic. No erythema. No pallor.  Psychiatric: He has a normal mood and affect. His behavior is normal. Judgment and thought content normal.     Lab Results  Component Value Date   WBC 6.6 06/07/2012   HGB 14.8 06/07/2012   HCT 41.0 06/07/2012   PLT 201 06/07/2012   GLUCOSE 139* 06/07/2012   ALT 15 06/07/2012   AST 17 06/07/2012   NA 140 06/07/2012   K 3.7 06/07/2012   CL 102 06/07/2012   CREATININE 0.91 06/07/2012   BUN 12 06/07/2012   CO2 28 06/07/2012   TSH 0.22* 05/15/2012   INR 1.08 05/12/2012       Assessment & Plan:

## 2012-07-24 NOTE — Assessment & Plan Note (Signed)
Continue xarelto

## 2012-07-24 NOTE — Assessment & Plan Note (Signed)
I will check a plain film to see if there is a fx He can't take nsaids due to his anticoagulation He has s/s of rotator cuff sprain - if symptoms persist will refer for PT

## 2012-07-24 NOTE — Assessment & Plan Note (Signed)
His HA has improved on the TCA I await the results of his MRI

## 2012-07-24 NOTE — Patient Instructions (Signed)
Shoulder Pain The shoulder is the joint that connects your arms to your body. The bones that form the shoulder joint include the upper arm bone (humerus), the shoulder blade (scapula), and the collarbone (clavicle). The top of the humerus is shaped like a ball and fits into a rather flat socket on the scapula (glenoid cavity). A combination of muscles and strong, fibrous tissues that connect muscles to bones (tendons) support your shoulder joint and hold the ball in the socket. Small, fluid-filled sacs (bursae) are located in different areas of the joint. They act as cushions between the bones and the overlying soft tissues and help reduce friction between the gliding tendons and the bone as you move your arm. Your shoulder joint allows a wide range of motion in your arm. This range of motion allows you to do things like scratch your back or throw a ball. However, this range of motion also makes your shoulder more prone to pain from overuse and injury. Causes of shoulder pain can originate from both injury and overuse and usually can be grouped in the following four categories:  Redness, swelling, and pain (inflammation) of the tendon (tendinitis) or the bursae (bursitis).  Instability, such as a dislocation of the joint.  Inflammation of the joint (arthritis).  Broken bone (fracture). HOME CARE INSTRUCTIONS   Apply ice to the sore area.  Put ice in a plastic bag.  Place a towel between your skin and the bag.  Leave the ice on for 15 to 20 minutes, 3 to 4 times per day for the first 2 days.  If you have a shoulder sling or immobilizer, wear it as long as your caregiver instructs. Only remove it to shower or bathe. Move your arm as little as possible, but keep your hand moving to prevent swelling.  Only take over-the-counter or prescription medicines for pain, discomfort, or fever as directed by your caregiver. SEEK MEDICAL CARE IF:   Your shoulder pain increases, or new pain develops in  your arm, hand, or fingers.  Your hand or fingers become cold and numb.  Your pain is not relieved with medicines. SEEK IMMEDIATE MEDICAL CARE IF:   Your arm, hand, or fingers are numb or tingling.  Your arm, hand, or fingers are significantly swollen or turn white or blue. MAKE SURE YOU:   Understand these instructions.  Will watch your condition.  Will get help right away if you are not doing well or get worse. Document Released: 04/13/2005 Document Revised: 09/26/2011 Document Reviewed: 06/18/2011 [employer] Patient Information 2013 [employer].  

## 2012-07-25 DIAGNOSIS — M21929 Unspecified acquired deformity of unspecified upper arm: Secondary | ICD-10-CM | POA: Insufficient documentation

## 2012-07-25 NOTE — Addendum Note (Signed)
Addended by: Etta Grandchild on: 07/25/2012 10:56 AM   Modules accepted: Orders

## 2012-08-06 ENCOUNTER — Other Ambulatory Visit: Payer: 59

## 2012-08-24 ENCOUNTER — Telehealth: Payer: Self-pay | Admitting: Internal Medicine

## 2012-08-24 DIAGNOSIS — G43909 Migraine, unspecified, not intractable, without status migrainosus: Secondary | ICD-10-CM

## 2012-08-24 DIAGNOSIS — M19079 Primary osteoarthritis, unspecified ankle and foot: Secondary | ICD-10-CM

## 2012-08-24 MED ORDER — OXYCODONE-ACETAMINOPHEN 10-325 MG PO TABS: 1.0000 | ORAL_TABLET | Freq: Three times a day (TID) | ORAL | Status: DC | PRN

## 2012-08-24 NOTE — Telephone Encounter (Signed)
The pt called hoping to get a refill on percocet 10-325mg  filled at the [facility] on [address].  Pt's callback - [phone]

## 2012-08-24 NOTE — Telephone Encounter (Signed)
He will need to pick it up

## 2012-08-24 NOTE — Telephone Encounter (Signed)
Rx ready for pick up//LMOVM

## 2012-08-28 ENCOUNTER — Emergency Department (HOSPITAL_COMMUNITY)
Admission: EM | Admit: 2012-08-28 | Discharge: 2012-08-28 | Disposition: A | Discharge status: Home / Self Care | Payer: Worker's Compensation | Attending: Emergency Medicine | Admitting: Emergency Medicine

## 2012-08-28 ENCOUNTER — Encounter (HOSPITAL_COMMUNITY): Payer: Self-pay | Admitting: Physical Medicine and Rehabilitation

## 2012-08-28 DIAGNOSIS — Y990 Civilian activity done for income or pay: Secondary | ICD-10-CM | POA: Insufficient documentation

## 2012-08-28 DIAGNOSIS — Y9229 Other specified public building as the place of occurrence of the external cause: Secondary | ICD-10-CM | POA: Insufficient documentation

## 2012-08-28 DIAGNOSIS — Z79899 Other long term (current) drug therapy: Secondary | ICD-10-CM | POA: Insufficient documentation

## 2012-08-28 DIAGNOSIS — S3130XA Unspecified open wound of scrotum and testes, initial encounter: Secondary | ICD-10-CM | POA: Insufficient documentation

## 2012-08-28 DIAGNOSIS — Z86718 Personal history of other venous thrombosis and embolism: Secondary | ICD-10-CM | POA: Insufficient documentation

## 2012-08-28 DIAGNOSIS — E079 Disorder of thyroid, unspecified: Secondary | ICD-10-CM | POA: Insufficient documentation

## 2012-08-28 DIAGNOSIS — F17200 Nicotine dependence, unspecified, uncomplicated: Secondary | ICD-10-CM | POA: Insufficient documentation

## 2012-08-28 DIAGNOSIS — Z862 Personal history of diseases of the blood and blood-forming organs and certain disorders involving the immune mechanism: Secondary | ICD-10-CM | POA: Insufficient documentation

## 2012-08-28 DIAGNOSIS — W268XXA Contact with other sharp object(s), not elsewhere classified, initial encounter: Secondary | ICD-10-CM | POA: Insufficient documentation

## 2012-08-28 DIAGNOSIS — Z8739 Personal history of other diseases of the musculoskeletal system and connective tissue: Secondary | ICD-10-CM | POA: Insufficient documentation

## 2012-08-28 DIAGNOSIS — Z23 Encounter for immunization: Secondary | ICD-10-CM | POA: Insufficient documentation

## 2012-08-28 DIAGNOSIS — S3131XA Laceration without foreign body of scrotum and testes, initial encounter: Secondary | ICD-10-CM

## 2012-08-28 DIAGNOSIS — IMO0002 Reserved for concepts with insufficient information to code with codable children: Secondary | ICD-10-CM | POA: Insufficient documentation

## 2012-08-28 MED ORDER — OXYCODONE-ACETAMINOPHEN 5-325 MG PO TABS: 1.0000 | ORAL_TABLET | ORAL | Status: DC | PRN

## 2012-08-28 MED ORDER — DEXTROSE 5 % IV SOLN
1.0000 g | Freq: Once | INTRAVENOUS | Status: AC
  Administered 2012-08-28: 1 g via INTRAVENOUS
  Filled 2012-08-28: qty 10

## 2012-08-28 MED ORDER — TETANUS-DIPHTH-ACELL PERTUSSIS 5-2.5-18.5 LF-MCG/0.5 IM SUSP
0.5000 mL | Freq: Once | INTRAMUSCULAR | Status: AC
  Administered 2012-08-28: 0.5 mL via INTRAMUSCULAR
  Filled 2012-08-28: qty 0.5

## 2012-08-28 MED ORDER — OXYCODONE HCL 5 MG PO TABS: 12.0000 mg | ORAL_TABLET | ORAL | Status: DC | PRN

## 2012-08-28 MED ORDER — HYDROMORPHONE HCL PF 1 MG/ML IJ SOLN: 1.0000 mg | Freq: Once | INTRAMUSCULAR | Status: AC

## 2012-08-28 MED ORDER — HYDROMORPHONE HCL PF 1 MG/ML IJ SOLN
1.0000 mg | INTRAMUSCULAR | Status: AC
  Administered 2012-08-28: 1 mg via INTRAVENOUS
  Filled 2012-08-28: qty 1

## 2012-08-28 NOTE — ED Notes (Signed)
Lab called for workers comp drug test.

## 2012-08-28 NOTE — ED Notes (Signed)
Warm blanket given

## 2012-08-28 NOTE — ED Notes (Signed)
Pt brought back to room from triage via wheelchair, pt getting undressed and into a gown; attempted to assist pt but pt wanted to get undressed himself

## 2012-08-28 NOTE — ED Provider Notes (Signed)
History     CSN: [id number]  Arrival date & time 08/28/12  1226   First MD Initiated Contact with Patient 08/28/12 1306      Chief Complaint  Patient presents with  . Testicle Pain    (Consider location/radiation/quality/duration/timing/severity/associated sxs/prior treatment) HPI  Patient presents complaining of laceration to right scrotum. He states he was working when a wall with a nail sticking out fell. It cut through his shirt and 3 his jeans lacerating the right scrotal area. He denies other injury. He is complaining of pain. He has not treated this with anything. He is unclear when his last tetanus shot was. He states he thought that something came out of the cut he had to talk back in. He is on blood thinner for a DVT. He did not fall or strike his head and has no neck pain.  Past Medical History  Diagnosis Date  . TMJ syndrome   . Arthritis   . Thyroid disease   . DVT (deep venous thrombosis)   . Anemia     Past Surgical History  Procedure Laterality Date  . Bilateral temporomandibular joint arthroplasty    . Arthroscopic repair acl      Family History  Problem Relation Age of Onset  . Arthritis Mother   . Hypertension Father   . Alcohol abuse Neg Hx   . Cancer Neg Hx   . Heart disease Neg Hx   . Hyperlipidemia Neg Hx   . Kidney disease Neg Hx   . Stroke Neg Hx   . Clotting disorder Neg Hx     History  Substance Use Topics  . Smoking status: Current Every Day Smoker    Types: Cigarettes  . Smokeless tobacco: Never Used  . Alcohol Use: No      Review of Systems  All other systems reviewed and are negative.    Allergies  Review of patient's allergies indicates no known allergies.  Home Medications   Current Outpatient Rx  Name  Route  Sig  Dispense  Refill  . amitriptyline (ELAVIL) 10 MG tablet   Oral   Take 1 tablet (10 mg total) by mouth at bedtime.   30 tablet   11   . Rivaroxaban (XARELTO) 20 MG TABS   Oral   Take 1 tablet (20  mg total) by mouth daily.   30 tablet   5   . oxyCODONE-acetaminophen (PERCOCET) 10-325 MG per tablet   Oral   Take 1 tablet by mouth 3 (three) times daily as needed. For pain   90 tablet   0     BP 135/76  Pulse 69  Temp(Src) 98.5 F (36.9 C) (Oral)  SpO2 97%  Physical Exam  Nursing note and vitals reviewed. Constitutional: He is oriented to person, place, and time. He appears well-developed and well-nourished.  HENT:  Head: Normocephalic and atraumatic.  Right Ear: External ear normal.  Left Ear: External ear normal.  Nose: Nose normal.  Mouth/Throat: Oropharynx is clear and moist.  Eyes: EOM are normal. Pupils are equal, round, and reactive to light.  Neck: Normal range of motion. Neck supple.  Cardiovascular: Normal rate, regular rhythm and normal heart sounds.   Pulmonary/Chest: Effort normal and breath sounds normal.  Abdominal: Soft. Bowel sounds are normal.  Genitourinary: Testes normal and penis normal.     4 cm laceration of scrotum the laceration is in the subcutaneous tissues but does not extend through the subcutaneous tissue. Bottom of the wound or palpated  and fascia plane remains intact. Testicles are nontender.  Musculoskeletal: Normal range of motion.  Neurological: He is alert and oriented to person, place, and time.  Skin: Skin is warm and dry.  Abrasion to abdomen 3 cm.  Psychiatric: He has a normal mood and affect. His behavior is normal. Judgment and thought content normal.    ED Course  Procedures (including critical care time)  Labs Reviewed - No data to display No results found.   No diagnosis found.    MDM  Please see Ms. Beck's note on laceration repair. I have discussed the care of the incision with the patient. He is advised to not do any lifting for a week. He is advised to stay flat with pressure to area to minimize any leading as he is on blood thinners. He is advised that this occurs he should be rechecked. He is given Percocet  for pain. He was given tetanus here in the emergency department and 1/7 was infused the time the wound was being cared for.        Hilario Quarry, MD 08/28/12 [phone]

## 2012-08-28 NOTE — ED Provider Notes (Signed)
LACERATION REPAIR Performed by: [facility] Nurse Authorized by: [facility] Nurse Consent: Verbal consent obtained. Risks and benefits: risks, benefits and alternatives were discussed Consent given by: patient Patient identity confirmed: provided demographic data Prepped and Draped in normal sterile fashion Wound explored  Laceration Location: right anterior scrotum  Laceration Length: 3.5 cm  No Foreign Bodies seen or palpated  Anesthesia: local infiltration  Local anesthetic: lidocaine 2% without epinephrine  Anesthetic total: 8 ml  Irrigation method: syringe Amount of cleaning: standard  Skin closure: 5-0 prolene  Number of sutures: 5  Technique: simple interrupted  Patient tolerance: Patient tolerated the procedure well with no immediate complications.  [facility] Nurse, PA-C 08/28/12 [phone]

## 2012-08-28 NOTE — ED Notes (Signed)
PA at bedside suturing patient.

## 2012-08-28 NOTE — ED Notes (Signed)
Pt presents to department for evaluation of testicular injury. States he was working when nail from bookcase fell on abdomen and lacerated R testicle. 10/10 pain upon arrival, bleeding controlled. Pt is alert and oriented x4.

## 2012-09-01 NOTE — ED Provider Notes (Signed)
History/physical exam/procedure(s) were performed by non-physician practitioner and as supervising physician I was immediately available for consultation/collaboration. I have reviewed all notes and am in agreement with care and plan.   Hilario Quarry, MD 09/01/12 [phone]

## 2012-09-03 ENCOUNTER — Telehealth (HOSPITAL_COMMUNITY): Payer: Self-pay | Admitting: Emergency Medicine

## 2012-09-06 ENCOUNTER — Encounter (HOSPITAL_COMMUNITY): Payer: Self-pay | Admitting: Emergency Medicine

## 2012-09-06 ENCOUNTER — Emergency Department (HOSPITAL_COMMUNITY)
Admission: EM | Admit: 2012-09-06 | Discharge: 2012-09-06 | Disposition: A | Discharge status: Home / Self Care | Payer: Worker's Compensation | Attending: Emergency Medicine | Admitting: Emergency Medicine

## 2012-09-06 ENCOUNTER — Telehealth (HOSPITAL_COMMUNITY): Payer: Self-pay | Admitting: *Deleted

## 2012-09-06 DIAGNOSIS — F17200 Nicotine dependence, unspecified, uncomplicated: Secondary | ICD-10-CM | POA: Insufficient documentation

## 2012-09-06 DIAGNOSIS — Z86718 Personal history of other venous thrombosis and embolism: Secondary | ICD-10-CM | POA: Insufficient documentation

## 2012-09-06 DIAGNOSIS — S3131XD Laceration without foreign body of scrotum and testes, subsequent encounter: Secondary | ICD-10-CM

## 2012-09-06 DIAGNOSIS — Z8739 Personal history of other diseases of the musculoskeletal system and connective tissue: Secondary | ICD-10-CM | POA: Insufficient documentation

## 2012-09-06 DIAGNOSIS — Z862 Personal history of diseases of the blood and blood-forming organs and certain disorders involving the immune mechanism: Secondary | ICD-10-CM | POA: Insufficient documentation

## 2012-09-06 DIAGNOSIS — E079 Disorder of thyroid, unspecified: Secondary | ICD-10-CM | POA: Insufficient documentation

## 2012-09-06 DIAGNOSIS — Z4801 Encounter for change or removal of surgical wound dressing: Secondary | ICD-10-CM | POA: Insufficient documentation

## 2012-09-06 DIAGNOSIS — Z79899 Other long term (current) drug therapy: Secondary | ICD-10-CM | POA: Insufficient documentation

## 2012-09-06 DIAGNOSIS — M26609 Unspecified temporomandibular joint disorder, unspecified side: Secondary | ICD-10-CM | POA: Insufficient documentation

## 2012-09-06 MED ORDER — CEPHALEXIN 250 MG PO CAPS: 250.0000 mg | ORAL_CAPSULE | Freq: Four times a day (QID) | ORAL | Status: DC

## 2012-09-06 NOTE — ED Provider Notes (Signed)
42 year old male presents for wound check following repair of laceration of the scrotum. There is mild erythema of the wound in the wound does not appear to be solidly closed. Sutures will be left in for another 4 days and then you'll need to be checked again. He will be started on antibiotics for possible early wound infection.  I saw and evaluated the patient, reviewed the resident's note and I agree with the findings and plan.   Dione Booze, MD 09/06/12 [phone]

## 2012-09-06 NOTE — ED Notes (Signed)
Pt. Stated, I'm here to have stitches removed in the scrotum last Tues. Feb. 11.  It is still very uncomfortable and I'm not really sure.

## 2012-09-06 NOTE — ED Provider Notes (Signed)
History     CSN: [id number]  Arrival date & time 09/06/12  0758   First MD Initiated Contact with Patient 09/06/12 0802      Chief Complaint  Patient presents with  . Wound Check    (Consider location/radiation/quality/duration/timing/severity/associated sxs/prior treatment) Patient is a 42 y.o. male presenting with wound check. The history is provided by the patient. No language interpreter was used.  Wound Check This is a recurrent problem. The current episode started 1 to 4 weeks ago (9 days). The problem has been gradually improving. Pertinent negatives include no abdominal pain, chills, congestion, coughing, diaphoresis, fatigue, fever, headaches, nausea, neck pain, numbness, sore throat, vomiting or weakness. Exacerbated by: standing. Treatments tried: elevation, briefs. The treatment provided mild relief.    Past Medical History  Diagnosis Date  . TMJ syndrome   . Arthritis   . Thyroid disease   . DVT (deep venous thrombosis)   . Anemia     Past Surgical History  Procedure Laterality Date  . Bilateral temporomandibular joint arthroplasty    . Arthroscopic repair acl      Family History  Problem Relation Age of Onset  . Arthritis Mother   . Hypertension Father   . Alcohol abuse Neg Hx   . Cancer Neg Hx   . Heart disease Neg Hx   . Hyperlipidemia Neg Hx   . Kidney disease Neg Hx   . Stroke Neg Hx   . Clotting disorder Neg Hx     History  Substance Use Topics  . Smoking status: Current Every Day Smoker    Types: Cigarettes  . Smokeless tobacco: Never Used  . Alcohol Use: No      Review of Systems  Constitutional: Negative for fever, chills, diaphoresis, activity change, appetite change and fatigue.  HENT: Negative for congestion, sore throat, facial swelling, rhinorrhea, drooling, neck pain and voice change.   Respiratory: Negative for cough, shortness of breath and stridor.   Gastrointestinal: Negative for nausea, vomiting, abdominal pain, diarrhea  and abdominal distention.  Endocrine: Negative for polydipsia and polyuria.  Genitourinary: Negative for dysuria, urgency, frequency and decreased urine volume.       Scrotal pain  Musculoskeletal: Negative for back pain and gait problem.  Skin: Negative for color change and wound.  Neurological: Negative for facial asymmetry, weakness, numbness and headaches.  Hematological: Does not bruise/bleed easily.  Psychiatric/Behavioral: Negative for confusion and agitation.    Allergies  Review of patient's allergies indicates no known allergies.  Home Medications   Current Outpatient Rx  Name  Route  Sig  Dispense  Refill  . amitriptyline (ELAVIL) 10 MG tablet   Oral   Take 1 tablet (10 mg total) by mouth at bedtime.   30 tablet   11   . oxyCODONE (ROXICODONE) 5 MG immediate release tablet   Oral   Take 2.5 tablets (12.5 mg total) by mouth every 4 (four) hours as needed for pain.   30 tablet   0   . Rivaroxaban (XARELTO) 20 MG TABS   Oral   Take 1 tablet (20 mg total) by mouth daily.   30 tablet   5   . cephALEXin (KEFLEX) 250 MG capsule   Oral   Take 1 capsule (250 mg total) by mouth 4 (four) times daily.   28 capsule   0     BP 128/74  Pulse 65  Temp(Src) 97.9 F (36.6 C) (Oral)  Resp 16  SpO2 100%  Physical Exam  Constitutional:  He is oriented to person, place, and time. He appears well-developed and well-nourished. No distress.  HENT:  Head: Normocephalic and atraumatic.  Mouth/Throat: No oropharyngeal exudate.  Eyes: Pupils are equal, round, and reactive to light.  Neck: Normal range of motion. Neck supple.  Cardiovascular: Normal rate, regular rhythm and normal heart sounds.  Exam reveals no gallop and no friction rub.   No murmur heard. Pulmonary/Chest: Effort normal and breath sounds normal. No respiratory distress. He has no wheezes. He has no rales.  Abdominal: Soft. Bowel sounds are normal. He exhibits no distension and no mass. There is no  tenderness. There is no rebound and no guarding.  Genitourinary:     Musculoskeletal: Normal range of motion. He exhibits no edema and no tenderness.  Neurological: He is alert and oriented to person, place, and time.  Skin: Skin is warm and dry.  Psychiatric: He has a normal mood and affect.    ED Course  Procedures (including critical care time)  Labs Reviewed - No data to display No results found.   1. Scrotal laceration, subsequent encounter       MDM  Pt is a 42 y.o. male with pertinent PMHX of scrotal laceration repair on 2/11 who presents for wound check and possible suture removal.  He is still having scrotal soreness, but has been wearing briefs.  No fever, drainage, urinary symptoms.  On exam, it does not appear superficial skin layer healed and I do not believe sutures should be removed at this point.  He does have some mild surrounding erythema and will be started on keflex for possible early cellulitis.  Return precautions given for new or worsening symptoms.  Pt asked to return in 4-5 day for wound recheck.     1. Scrotal laceration, subsequent encounter     Labs and imaging considered in decision making, reviewed by myself.  Imaging interpreted by radiology. Pt care discussed with my attending, Dr. Preston Fleeting.     Toy Cookey, MD 09/06/12 [phone]

## 2012-09-13 ENCOUNTER — Emergency Department (HOSPITAL_COMMUNITY)
Admission: EM | Admit: 2012-09-13 | Discharge: 2012-09-13 | Disposition: A | Discharge status: Home / Self Care | Payer: Worker's Compensation | Attending: Emergency Medicine | Admitting: Emergency Medicine

## 2012-09-13 DIAGNOSIS — Z862 Personal history of diseases of the blood and blood-forming organs and certain disorders involving the immune mechanism: Secondary | ICD-10-CM | POA: Insufficient documentation

## 2012-09-13 DIAGNOSIS — N509 Disorder of male genital organs, unspecified: Secondary | ICD-10-CM | POA: Insufficient documentation

## 2012-09-13 DIAGNOSIS — Z86718 Personal history of other venous thrombosis and embolism: Secondary | ICD-10-CM | POA: Insufficient documentation

## 2012-09-13 DIAGNOSIS — Z8669 Personal history of other diseases of the nervous system and sense organs: Secondary | ICD-10-CM | POA: Insufficient documentation

## 2012-09-13 DIAGNOSIS — Z8739 Personal history of other diseases of the musculoskeletal system and connective tissue: Secondary | ICD-10-CM | POA: Insufficient documentation

## 2012-09-13 DIAGNOSIS — Z79899 Other long term (current) drug therapy: Secondary | ICD-10-CM | POA: Insufficient documentation

## 2012-09-13 DIAGNOSIS — Z8639 Personal history of other endocrine, nutritional and metabolic disease: Secondary | ICD-10-CM | POA: Insufficient documentation

## 2012-09-13 DIAGNOSIS — Z4802 Encounter for removal of sutures: Secondary | ICD-10-CM

## 2012-09-13 DIAGNOSIS — F17200 Nicotine dependence, unspecified, uncomplicated: Secondary | ICD-10-CM | POA: Insufficient documentation

## 2012-09-13 MED ORDER — HYDROCODONE-ACETAMINOPHEN 5-325 MG PO TABS: 2.0000 | ORAL_TABLET | Freq: Four times a day (QID) | ORAL | Status: DC | PRN

## 2012-09-13 NOTE — ED Notes (Signed)
Suture removal kit at bedside 

## 2012-09-13 NOTE — ED Provider Notes (Signed)
History     CSN: [id number]  Arrival date & time 09/13/12  1407   First MD Initiated Contact with Patient 09/06/12 0802      No chief complaint on file.   (Consider location/radiation/quality/duration/timing/severity/associated sxs/prior treatment) HPI Comments: Is a 42 year old male, who presents emergency department with chief complaint of wound check and suture removal. Patient states that approximately 2 weeks ago, he was lifting a shelf, when it fell, and an exposed nail cut him on the scrotum. At that time the incision was repaired with sutures. He returned to the hospital approximately 7 days later for which check and suture removal, it was determined at that time that the wound was not ready to have the sutures removed. Today he presents for suture removal. He is also complaining of pain at the site of the incision, which she attributes to his job where he has to walk a lot. He is tried taking Advil with some relief. He does not have any pain in his testicle. Denies any urinary complaints.  The history is provided by the patient. No language interpreter was used.    Past Medical History  Diagnosis Date  . TMJ syndrome   . Arthritis   . Thyroid disease   . DVT (deep venous thrombosis)   . Anemia     Past Surgical History  Procedure Laterality Date  . Bilateral temporomandibular joint arthroplasty    . Arthroscopic repair acl      Family History  Problem Relation Age of Onset  . Arthritis Mother   . Hypertension Father   . Alcohol abuse Neg Hx   . Cancer Neg Hx   . Heart disease Neg Hx   . Hyperlipidemia Neg Hx   . Kidney disease Neg Hx   . Stroke Neg Hx   . Clotting disorder Neg Hx     History  Substance Use Topics  . Smoking status: Current Every Day Smoker    Types: Cigarettes  . Smokeless tobacco: Never Used  . Alcohol Use: No      Review of Systems  Constitutional: Negative for activity change and appetite change.  HENT: Negative for facial  swelling, rhinorrhea, drooling and voice change.   Respiratory: Negative for shortness of breath and stridor.   Gastrointestinal: Negative for diarrhea and abdominal distention.  Endocrine: Negative for polydipsia and polyuria.  Genitourinary: Negative for dysuria, urgency, frequency and decreased urine volume.       Scrotal pain  Musculoskeletal: Negative for back pain and gait problem.  Skin: Negative for color change and wound.  Neurological: Negative for facial asymmetry.  Hematological: Does not bruise/bleed easily.  Psychiatric/Behavioral: Negative for confusion and agitation.    Allergies  Review of patient's allergies indicates no known allergies.  Home Medications   Current Outpatient Rx  Name  Route  Sig  Dispense  Refill  . amitriptyline (ELAVIL) 10 MG tablet   Oral   Take 1 tablet (10 mg total) by mouth at bedtime.   30 tablet   11   . cephALEXin (KEFLEX) 250 MG capsule   Oral   Take 250 mg by mouth 4 (four) times daily. Started on 2-20.  7 day course.  Patient has 2 capsules remaining         . Rivaroxaban (XARELTO) 20 MG TABS   Oral   Take 1 tablet (20 mg total) by mouth daily.   30 tablet   5   . HYDROcodone-acetaminophen (NORCO/VICODIN) 5-325 MG per tablet   Oral  Take 2 tablets by mouth every 6 (six) hours as needed for pain.   15 tablet   0     BP 137/69  Pulse 74  Temp(Src) 98.2 F (36.8 C) (Oral)  Resp 16  SpO2 97%  Physical Exam  Constitutional: He is oriented to person, place, and time. He appears well-developed and well-nourished. No distress.  HENT:  Head: Normocephalic and atraumatic.  Mouth/Throat: No oropharyngeal exudate.  Eyes: Pupils are equal, round, and reactive to light.  Neck: Normal range of motion. Neck supple.  Cardiovascular: Normal rate, regular rhythm and normal heart sounds.  Exam reveals no gallop and no friction rub.   No murmur heard. Pulmonary/Chest: Effort normal and breath sounds normal. No respiratory  distress. He has no wheezes. He has no rales.  Abdominal: Soft. Bowel sounds are normal. He exhibits no distension and no mass. There is no tenderness. There is no rebound and no guarding.  Genitourinary:     Musculoskeletal: Normal range of motion. He exhibits no edema and no tenderness.  Neurological: He is alert and oriented to person, place, and time.  Skin: Skin is warm and dry.  Psychiatric: He has a normal mood and affect.    ED Course  Procedures (including critical care time)  Labs Reviewed - No data to display No results found.   1. Visit for suture removal       MDM  Sutures were removed, I placed Steri-Strips over the affected area, that was not adherent. Believe this to be a better solution the sutures, some sutures were not approximating the affected area. The rest of the incision appears to be healing well. Patient still having a lot of pain complaints. I told him that the area is very sensitive, and that he used to take it easy. He is requesting some pain pills.I told him that given him a couple days pain pills, but that if the pain persists, he will need to followup with his primary care provider.   Roxy Horseman, PA-C 09/13/12 1559

## 2012-09-13 NOTE — ED Notes (Signed)
Here for suture removal on testicals

## 2012-09-13 NOTE — ED Notes (Signed)
Patient advised needs to have sutures removed from scrotal area.

## 2012-09-14 NOTE — ED Provider Notes (Signed)
Medical screening examination/treatment/procedure(s) were performed by non-physician practitioner and as supervising physician I was immediately available for consultation/collaboration.    Vida Roller, MD 09/14/12 [phone]

## 2012-09-24 ENCOUNTER — Encounter: Payer: Self-pay | Admitting: Internal Medicine

## 2012-09-24 ENCOUNTER — Ambulatory Visit (INDEPENDENT_AMBULATORY_CARE_PROVIDER_SITE_OTHER): Payer: 59 | Admitting: Internal Medicine

## 2012-09-24 ENCOUNTER — Other Ambulatory Visit (INDEPENDENT_AMBULATORY_CARE_PROVIDER_SITE_OTHER): Payer: 59

## 2012-09-24 VITALS — BP 118/70 | HR 66 | Temp 97.2°F | Resp 16 | Wt 198.8 lb

## 2012-09-24 DIAGNOSIS — I82401 Acute embolism and thrombosis of unspecified deep veins of right lower extremity: Secondary | ICD-10-CM

## 2012-09-24 DIAGNOSIS — R51 Headache: Secondary | ICD-10-CM

## 2012-09-24 DIAGNOSIS — M79662 Pain in left lower leg: Secondary | ICD-10-CM

## 2012-09-24 DIAGNOSIS — R946 Abnormal results of thyroid function studies: Secondary | ICD-10-CM

## 2012-09-24 DIAGNOSIS — Z5189 Encounter for other specified aftercare: Secondary | ICD-10-CM

## 2012-09-24 DIAGNOSIS — R519 Headache, unspecified: Secondary | ICD-10-CM

## 2012-09-24 DIAGNOSIS — R7989 Other specified abnormal findings of blood chemistry: Secondary | ICD-10-CM

## 2012-09-24 DIAGNOSIS — R7309 Other abnormal glucose: Secondary | ICD-10-CM | POA: Insufficient documentation

## 2012-09-24 DIAGNOSIS — I82409 Acute embolism and thrombosis of unspecified deep veins of unspecified lower extremity: Secondary | ICD-10-CM

## 2012-09-24 DIAGNOSIS — M19079 Primary osteoarthritis, unspecified ankle and foot: Secondary | ICD-10-CM

## 2012-09-24 DIAGNOSIS — M79669 Pain in unspecified lower leg: Secondary | ICD-10-CM | POA: Insufficient documentation

## 2012-09-24 DIAGNOSIS — M79609 Pain in unspecified limb: Secondary | ICD-10-CM

## 2012-09-24 DIAGNOSIS — M21922 Unspecified acquired deformity of left upper arm: Secondary | ICD-10-CM

## 2012-09-24 DIAGNOSIS — M2180 Other specified acquired deformities of unspecified limb: Secondary | ICD-10-CM

## 2012-09-24 DIAGNOSIS — S4992XD Unspecified injury of left shoulder and upper arm, subsequent encounter: Secondary | ICD-10-CM

## 2012-09-24 LAB — BASIC METABOLIC PANEL
BUN: 13 mg/dL (ref 6–23)
Calcium: 9 mg/dL (ref 8.4–10.5)
Creatinine, Ser: 0.8 mg/dL (ref 0.4–1.5)

## 2012-09-24 LAB — LIPID PANEL
Total CHOL/HDL Ratio: 3
VLDL: 30.2 mg/dL (ref 0.0–40.0)

## 2012-09-24 LAB — HEMOGLOBIN A1C: Hgb A1c MFr Bld: 5.1 % (ref 4.6–6.5)

## 2012-09-24 LAB — TSH: TSH: 0.54 u[IU]/mL (ref 0.35–5.50)

## 2012-09-24 MED ORDER — OXYCODONE-ACETAMINOPHEN 10-325 MG PO TABS: 1.0000 | ORAL_TABLET | Freq: Three times a day (TID) | ORAL | Status: DC | PRN

## 2012-09-24 NOTE — Assessment & Plan Note (Signed)
He will continue percocet for pain

## 2012-09-24 NOTE — Assessment & Plan Note (Signed)
I will check his D- dimer to see if he has evidence of more clot formation Will also get an U/S done to see if there is clot

## 2012-09-24 NOTE — Assessment & Plan Note (Signed)
He has not had the MRI done yet so I reordered it today

## 2012-09-24 NOTE — Assessment & Plan Note (Signed)
Ortho referral  

## 2012-09-24 NOTE — Progress Notes (Signed)
Subjective:    Patient ID: David Shelton, male    DOB: [birth year], 42 y.o.   MRN: [id number]  Arthritis Presents for follow-up visit. He complains of pain. He reports no stiffness, joint swelling or joint warmth. The symptoms have been stable. Affected locations include the right ankle and left shoulder. His pain is at a severity of 6/10. Pertinent negatives include no diarrhea, dry eyes, dry mouth, dysuria, fatigue, fever, pain at night, pain while resting, rash, Raynaud's syndrome, uveitis or weight loss. Compliance with total regimen is 51-75%.      Review of Systems  Constitutional: Negative for fever, chills, weight loss, diaphoresis, activity change, appetite change, fatigue and unexpected weight change.  Eyes: Negative.   Respiratory: Negative for cough, chest tightness, shortness of breath, wheezing and stridor.   Cardiovascular: Negative for chest pain, palpitations and leg swelling.  Gastrointestinal: Negative for nausea, vomiting, abdominal pain, diarrhea, constipation and anal bleeding.  Endocrine: Negative.   Genitourinary: Negative for dysuria, frequency, hematuria, flank pain, decreased urine volume, scrotal swelling, enuresis, difficulty urinating and testicular pain.  Musculoskeletal: Positive for arthralgias (right calf pain) and arthritis. Negative for myalgias, back pain, joint swelling, gait problem and stiffness.  Skin: Negative for color change, pallor, rash and wound.  Allergic/Immunologic: Negative.   Neurological: Positive for headaches (chronic, recurrent, some better on the TCA). Negative for dizziness, tremors, seizures, syncope, facial asymmetry, speech difficulty, weakness, light-headedness and numbness.  Hematological: Negative for adenopathy. Does not bruise/bleed easily.  Psychiatric/Behavioral: Negative.        Objective:   Physical Exam  Vitals reviewed. Constitutional: He is oriented to person, place, and time. He appears well-developed and  well-nourished. No distress.  HENT:  Head: Normocephalic and atraumatic.  Mouth/Throat: Oropharynx is clear and moist. No oropharyngeal exudate.  Eyes: Conjunctivae are normal. Right eye exhibits no discharge. Left eye exhibits no discharge. No scleral icterus.  Neck: Normal range of motion. Neck supple. No JVD present. No tracheal deviation present. No thyromegaly present.  Cardiovascular: Normal rate, regular rhythm, normal heart sounds and intact distal pulses.  Exam reveals no gallop and no friction rub.   No murmur heard. Pulmonary/Chest: Effort normal and breath sounds normal. No stridor. No respiratory distress. He has no wheezes. He has no rales. He exhibits no tenderness.  Abdominal: Soft. Bowel sounds are normal. He exhibits no distension and no mass. There is no tenderness. There is no rebound and no guarding.  Musculoskeletal: Normal range of motion. He exhibits no edema and no tenderness.  Lymphadenopathy:    He has no cervical adenopathy.  Neurological: He is alert and oriented to person, place, and time. He has normal strength. He displays no atrophy, no tremor and normal reflexes. No cranial nerve deficit or sensory deficit. He exhibits normal muscle tone. He displays a negative Romberg sign. He displays no seizure activity. Coordination and gait normal. He displays no Babinski's sign on the right side. He displays no Babinski's sign on the left side.  Reflex Scores:      Tricep reflexes are 1+ on the right side and 1+ on the left side.      Bicep reflexes are 1+ on the right side and 1+ on the left side.      Brachioradialis reflexes are 1+ on the right side and 1+ on the left side.      Patellar reflexes are 1+ on the right side and 1+ on the left side.      Achilles reflexes are 1+ on the  right side and 1+ on the left side. Skin: Skin is warm and dry. No rash noted. He is not diaphoretic. No erythema. No pallor.  Psychiatric: He has a normal mood and affect. His behavior is  normal. Judgment and thought content normal.     Lab Results  Component Value Date   WBC 6.6 06/07/2012   HGB 14.8 06/07/2012   HCT 41.0 06/07/2012   PLT 201 06/07/2012   GLUCOSE 139* 06/07/2012   ALT 15 06/07/2012   AST 17 06/07/2012   NA 140 06/07/2012   K 3.7 06/07/2012   CL 102 06/07/2012   CREATININE 0.91 06/07/2012   BUN 12 06/07/2012   CO2 28 06/07/2012   TSH 0.22* 05/15/2012   INR 1.08 05/12/2012       Assessment & Plan:

## 2012-09-24 NOTE — Assessment & Plan Note (Signed)
I will check his a1c to see if he has developed DM II 

## 2012-09-24 NOTE — Assessment & Plan Note (Signed)
He has not seen ortho yet so I reordered the referral today

## 2012-09-24 NOTE — Assessment & Plan Note (Signed)
Clinically he appears euthyroid I will recheck his TSH T3 T4 today

## 2012-09-24 NOTE — Assessment & Plan Note (Signed)
He has some recurrent symptoms today so I will check his d-dimer and an ultrasound of the RLE

## 2012-09-24 NOTE — Patient Instructions (Signed)

## 2012-09-25 ENCOUNTER — Encounter: Payer: Self-pay | Admitting: Internal Medicine

## 2012-09-25 LAB — T4: T4, Total: 6.7 ug/dL (ref 5.0–12.5)

## 2012-09-25 LAB — D-DIMER, QUANTITATIVE: D-Dimer, Quant: 0.29 ug/mL-FEU (ref 0.00–0.48)

## 2012-10-11 ENCOUNTER — Ambulatory Visit (INDEPENDENT_AMBULATORY_CARE_PROVIDER_SITE_OTHER): Payer: 59 | Admitting: Internal Medicine

## 2012-10-11 ENCOUNTER — Encounter: Payer: Self-pay | Admitting: Internal Medicine

## 2012-10-11 ENCOUNTER — Encounter (INDEPENDENT_AMBULATORY_CARE_PROVIDER_SITE_OTHER): Payer: 59

## 2012-10-11 ENCOUNTER — Ambulatory Visit
Admission: RE | Admit: 2012-10-11 | Discharge: 2012-10-11 | Disposition: A | Discharge status: Home / Self Care | Payer: 59 | Source: Ambulatory Visit | Attending: Internal Medicine | Admitting: Internal Medicine

## 2012-10-11 VITALS — BP 122/80 | HR 73 | Temp 97.1°F | Resp 16 | Wt 197.5 lb

## 2012-10-11 DIAGNOSIS — M7989 Other specified soft tissue disorders: Secondary | ICD-10-CM

## 2012-10-11 DIAGNOSIS — M19079 Primary osteoarthritis, unspecified ankle and foot: Secondary | ICD-10-CM

## 2012-10-11 DIAGNOSIS — I82402 Acute embolism and thrombosis of unspecified deep veins of left lower extremity: Secondary | ICD-10-CM

## 2012-10-11 DIAGNOSIS — I87009 Postthrombotic syndrome without complications of unspecified extremity: Secondary | ICD-10-CM

## 2012-10-11 DIAGNOSIS — I82409 Acute embolism and thrombosis of unspecified deep veins of unspecified lower extremity: Secondary | ICD-10-CM

## 2012-10-11 DIAGNOSIS — G4710 Hypersomnia, unspecified: Secondary | ICD-10-CM

## 2012-10-11 DIAGNOSIS — I82401 Acute embolism and thrombosis of unspecified deep veins of right lower extremity: Secondary | ICD-10-CM

## 2012-10-11 DIAGNOSIS — R51 Headache: Secondary | ICD-10-CM

## 2012-10-11 DIAGNOSIS — R519 Headache, unspecified: Secondary | ICD-10-CM

## 2012-10-11 DIAGNOSIS — M79661 Pain in right lower leg: Secondary | ICD-10-CM

## 2012-10-11 DIAGNOSIS — R209 Unspecified disturbances of skin sensation: Secondary | ICD-10-CM

## 2012-10-11 DIAGNOSIS — R200 Anesthesia of skin: Secondary | ICD-10-CM | POA: Insufficient documentation

## 2012-10-11 DIAGNOSIS — M79609 Pain in unspecified limb: Secondary | ICD-10-CM

## 2012-10-11 DIAGNOSIS — R202 Paresthesia of skin: Secondary | ICD-10-CM | POA: Insufficient documentation

## 2012-10-11 DIAGNOSIS — R400 Somnolence: Secondary | ICD-10-CM

## 2012-10-11 MED ORDER — OXYCODONE HCL ER 40 MG PO T12A: 40.0000 mg | EXTENDED_RELEASE_TABLET | Freq: Two times a day (BID) | ORAL | Status: DC

## 2012-10-11 NOTE — Patient Instructions (Signed)

## 2012-10-11 NOTE — Assessment & Plan Note (Signed)
I have asked him to be evaluated by sleep medicine 

## 2012-10-11 NOTE — Assessment & Plan Note (Signed)
MRI will be done soon

## 2012-10-11 NOTE — Assessment & Plan Note (Signed)
Repeat u/s today

## 2012-10-11 NOTE — Progress Notes (Signed)
Subjective:    Patient ID: David Shelton, male    DOB: 22-Nov-1970, 42 y.o.   MRN: [id number]  Arthritis Presents for follow-up visit. He complains of pain and stiffness. He reports no joint swelling or joint warmth. The symptoms have been stable. Affected location: diffusely in his right lower leg. His pain is at a severity of 6/10. Associated symptoms include pain at night and pain while resting. Pertinent negatives include no diarrhea, dry eyes, dry mouth, dysuria, fatigue, fever, rash, Raynaud's syndrome, uveitis or weight loss. Compliance with total regimen: he has been taking 6-8 percocet per day and is worried about the tylenol. Compliance with medications is 76-100%.      Review of Systems  Constitutional: Negative for fever, chills, weight loss, diaphoresis, activity change, appetite change, fatigue and unexpected weight change.  Eyes: Negative.   Respiratory: Negative.  Negative for cough, chest tightness, shortness of breath, wheezing and stridor.   Cardiovascular: Negative.  Negative for chest pain, palpitations and leg swelling.  Gastrointestinal: Negative.  Negative for nausea, vomiting, abdominal pain and diarrhea.  Endocrine: Negative.   Genitourinary: Negative.  Negative for dysuria.  Musculoskeletal: Positive for arthralgias, arthritis and stiffness. Negative for myalgias, back pain, joint swelling and gait problem.  Skin: Negative for color change, pallor, rash and wound.  Allergic/Immunologic: Negative.   Neurological: Positive for numbness (tingling in his right leg) and headaches (much better). Negative for dizziness, tremors, seizures, syncope, facial asymmetry, speech difficulty, weakness and light-headedness.  Hematological: Negative.  Negative for adenopathy. Does not bruise/bleed easily.  Psychiatric/Behavioral: Positive for sleep disturbance (he has insomnia at night then feels very sleepy during the day). Negative for suicidal ideas, hallucinations, behavioral  problems, confusion, self-injury, dysphoric mood, decreased concentration and agitation. The patient is not nervous/anxious and is not hyperactive.        Objective:   Physical Exam  Vitals reviewed. Constitutional: He is oriented to person, place, and time. He appears well-developed and well-nourished. No distress.  HENT:  Head: Normocephalic and atraumatic.  Mouth/Throat: Oropharynx is clear and moist. No oropharyngeal exudate.  Eyes: Conjunctivae are normal. Right eye exhibits no discharge. Left eye exhibits no discharge. No scleral icterus.  Neck: Normal range of motion. Neck supple. No JVD present. No tracheal deviation present. No thyromegaly present.  Cardiovascular: Normal rate, regular rhythm, normal heart sounds and intact distal pulses.  Exam reveals no gallop and no friction rub.   No murmur heard. Pulmonary/Chest: Effort normal and breath sounds normal. No stridor. No respiratory distress. He has no wheezes. He has no rales. He exhibits no tenderness.  Abdominal: Soft. Bowel sounds are normal. He exhibits no distension and no mass. There is no tenderness. There is no rebound and no guarding.  Musculoskeletal: Normal range of motion. He exhibits no edema and no tenderness.       Right lower leg: Normal. He exhibits no tenderness, no bony tenderness, no swelling, no edema, no deformity and no laceration.  Lymphadenopathy:    He has no cervical adenopathy.  Neurological: He is alert and oriented to person, place, and time. He has normal strength. He displays no atrophy, no tremor and normal reflexes. No cranial nerve deficit or sensory deficit. He exhibits normal muscle tone. He displays a negative Romberg sign. He displays no seizure activity. Coordination and gait normal. He displays no Babinski's sign on the right side. He displays no Babinski's sign on the left side.  Reflex Scores:      Tricep reflexes are 1+ on the  right side and 1+ on the left side.      Bicep reflexes are 1+  on the right side and 1+ on the left side.      Brachioradialis reflexes are 1+ on the right side.      Patellar reflexes are 1+ on the right side and 1+ on the left side.      Achilles reflexes are 1+ on the right side and 1+ on the left side. - SLR in BLE  Skin: Skin is warm and dry. No rash noted. He is not diaphoretic. No erythema. No pallor.  Psychiatric: He has a normal mood and affect. His behavior is normal. Judgment and thought content normal.      Lab Results  Component Value Date   WBC 6.6 06/07/2012   HGB 14.8 06/07/2012   HCT 41.0 06/07/2012   PLT 201 06/07/2012   GLUCOSE 97 09/24/2012   CHOL 130 09/24/2012   TRIG 151.0* 09/24/2012   HDL 45.70 09/24/2012   LDLCALC 54 09/24/2012   ALT 15 06/07/2012   AST 17 06/07/2012   NA 139 09/24/2012   K 4.0 09/24/2012   CL 104 09/24/2012   CREATININE 0.8 09/24/2012   BUN 13 09/24/2012   CO2 28 09/24/2012   TSH 0.54 09/24/2012   INR 1.08 05/12/2012   HGBA1C 5.1 09/24/2012      Assessment & Plan:

## 2012-10-11 NOTE — Assessment & Plan Note (Signed)
Will change percocet to oxycontin

## 2012-10-11 NOTE — Assessment & Plan Note (Signed)
Neurology referral 

## 2012-10-12 ENCOUNTER — Other Ambulatory Visit: Payer: Self-pay | Admitting: Internal Medicine

## 2012-10-12 DIAGNOSIS — G43909 Migraine, unspecified, not intractable, without status migrainosus: Secondary | ICD-10-CM

## 2012-10-17 ENCOUNTER — Institutional Professional Consult (permissible substitution): Payer: Self-pay | Admitting: Pulmonary Disease

## 2012-11-08 ENCOUNTER — Telehealth: Payer: Self-pay | Admitting: *Deleted

## 2012-11-08 NOTE — Telephone Encounter (Signed)
Message copied by Harlon Flor, Mccartney Brucks L on Thu Nov 08, 2012 11:13 AM ------      Message from: Warren Lacy A      Created: Thu Nov 08, 2012  8:50 AM      Contact: David Shelton said he had a missed call from [place], and he think its about scheduling his testing. Wants someone to please call him back at [phone]            Thanks ------

## 2012-11-16 ENCOUNTER — Ambulatory Visit: Payer: 59 | Admitting: Neurology

## 2012-11-28 ENCOUNTER — Ambulatory Visit: Payer: Self-pay | Admitting: Internal Medicine

## 2013-01-07 ENCOUNTER — Other Ambulatory Visit: Payer: Self-pay | Admitting: Internal Medicine

## 2013-01-07 DIAGNOSIS — M79661 Pain in right lower leg: Secondary | ICD-10-CM

## 2013-01-07 MED ORDER — OXYCODONE HCL ER 40 MG PO T12A: 40.0000 mg | EXTENDED_RELEASE_TABLET | Freq: Two times a day (BID) | ORAL | Status: DC

## 2013-01-07 NOTE — Telephone Encounter (Signed)
Please advise refill? 

## 2013-01-07 NOTE — Telephone Encounter (Signed)
Request 3 mth refill on OxyCODONE (OXYCONTIN) 40 mg

## 2013-01-11 ENCOUNTER — Ambulatory Visit (INDEPENDENT_AMBULATORY_CARE_PROVIDER_SITE_OTHER): Payer: 59 | Admitting: Internal Medicine

## 2013-01-11 ENCOUNTER — Encounter: Payer: Self-pay | Admitting: Internal Medicine

## 2013-01-11 VITALS — BP 120/74 | HR 67 | Temp 98.0°F | Resp 16 | Wt 197.5 lb

## 2013-01-11 DIAGNOSIS — M5116 Intervertebral disc disorders with radiculopathy, lumbar region: Secondary | ICD-10-CM

## 2013-01-11 DIAGNOSIS — I82401 Acute embolism and thrombosis of unspecified deep veins of right lower extremity: Secondary | ICD-10-CM

## 2013-01-11 DIAGNOSIS — M25469 Effusion, unspecified knee: Secondary | ICD-10-CM | POA: Insufficient documentation

## 2013-01-11 DIAGNOSIS — M25461 Effusion, right knee: Secondary | ICD-10-CM

## 2013-01-11 DIAGNOSIS — M79661 Pain in right lower leg: Secondary | ICD-10-CM

## 2013-01-11 DIAGNOSIS — M19079 Primary osteoarthritis, unspecified ankle and foot: Secondary | ICD-10-CM

## 2013-01-11 DIAGNOSIS — M5416 Radiculopathy, lumbar region: Secondary | ICD-10-CM | POA: Insufficient documentation

## 2013-01-11 DIAGNOSIS — IMO0002 Reserved for concepts with insufficient information to code with codable children: Secondary | ICD-10-CM

## 2013-01-11 DIAGNOSIS — I82409 Acute embolism and thrombosis of unspecified deep veins of unspecified lower extremity: Secondary | ICD-10-CM

## 2013-01-11 DIAGNOSIS — M5126 Other intervertebral disc displacement, lumbar region: Secondary | ICD-10-CM

## 2013-01-11 MED ORDER — METHOCARBAMOL 750 MG PO TABS: 750.0000 mg | ORAL_TABLET | Freq: Four times a day (QID) | ORAL | Status: DC

## 2013-01-11 MED ORDER — METHYLPREDNISOLONE ACETATE 80 MG/ML IJ SUSP
80.0000 mg | Freq: Once | INTRAMUSCULAR | Status: AC
Start: 2013-01-11 — End: 2013-01-11
  Administered 2013-01-11: 80 mg via INTRAMUSCULAR

## 2013-01-11 MED ORDER — IBUPROFEN 600 MG PO TABS: 600.0000 mg | ORAL_TABLET | Freq: Four times a day (QID) | ORAL | Status: DC | PRN

## 2013-01-11 MED ORDER — OXYCODONE HCL ER 40 MG PO T12A: 40.0000 mg | EXTENDED_RELEASE_TABLET | Freq: Two times a day (BID) | ORAL | Status: DC

## 2013-01-11 MED ORDER — METHYLPREDNISOLONE ACETATE 40 MG/ML IJ SUSP
40.0000 mg | Freq: Once | INTRAMUSCULAR | Status: AC
  Administered 2013-01-11: 40 mg via INTRAMUSCULAR

## 2013-01-11 NOTE — Assessment & Plan Note (Signed)
I have asked him to stop taking xarelto I believe his prior DVT was associated with an injury and that he does not need long term anticoagulation

## 2013-01-11 NOTE — Assessment & Plan Note (Signed)
I have asked him to see ortho about this 

## 2013-01-11 NOTE — Patient Instructions (Signed)
Back Pain, Adult  Low back pain is very common. About 1 in 5 people have back pain. The cause of low back pain is rarely dangerous. The pain often gets better over time. About half of people with a sudden onset of back pain feel better in just 2 weeks. About 8 in 10 people feel better by 6 weeks.   CAUSES  Some common causes of back pain include:  · Strain of the muscles or ligaments supporting the spine.  · Wear and tear (degeneration) of the spinal discs.  · Arthritis.  · Direct injury to the back.  DIAGNOSIS  Most of the time, the direct cause of low back pain is not known. However, back pain can be treated effectively even when the exact cause of the pain is unknown. Answering your caregiver's questions about your overall health and symptoms is one of the most accurate ways to make sure the cause of your pain is not dangerous. If your caregiver needs more information, he or she may order lab work or imaging tests (X-rays or MRIs). However, even if imaging tests show changes in your back, this usually does not require surgery.  HOME CARE INSTRUCTIONS  For many people, back pain returns. Since low back pain is rarely dangerous, it is often a condition that people can learn to manage on their own.   · Remain active. It is stressful on the back to sit or stand in one place. Do not sit, drive, or stand in one place for more than 30 minutes at a time. Take short walks on level surfaces as soon as pain allows. Try to increase the length of time you walk each day.  · Do not stay in bed. Resting more than 1 or 2 days can delay your recovery.  · Do not avoid exercise or work. Your body is made to move. It is not dangerous to be active, even though your back may hurt. Your back will likely heal faster if you return to being active before your pain is gone.  · Pay attention to your body when you  bend and lift. Many people have less discomfort when lifting if they bend their knees, keep the load close to their bodies, and  avoid twisting. Often, the most comfortable positions are those that put less stress on your recovering back.  · Find a comfortable position to sleep. Use a firm mattress and lie on your side with your knees slightly bent. If you lie on your back, put a pillow under your knees.  · Only take over-the-counter or prescription medicines as directed by your caregiver. Over-the-counter medicines to reduce pain and inflammation are often the most helpful. Your caregiver may prescribe muscle relaxant drugs. These medicines help dull your pain so you can more quickly return to your normal activities and healthy exercise.  · Put ice on the injured area.  · Put ice in a plastic bag.  · Place a towel between your skin and the bag.  · Leave the ice on for 15-20 minutes, 3-4 times a day for the first 2 to 3 days. After that, ice and heat may be alternated to reduce pain and spasms.  · Ask your caregiver about trying back exercises and gentle massage. This may be of some benefit.  · Avoid feeling anxious or stressed. Stress increases muscle tension and can worsen back pain. It is important to recognize when you are anxious or stressed and learn ways to manage it. Exercise is a great option.  SEEK MEDICAL CARE IF:  · You have pain that is not relieved with rest or   medicine.  · You have pain that does not improve in 1 week.  · You have new symptoms.  · You are generally not feeling well.  SEEK IMMEDIATE MEDICAL CARE IF:   · You have pain that radiates from your back into your legs.  · You develop new bowel or bladder control problems.  · You have unusual weakness or numbness in your arms or legs.  · You develop nausea or vomiting.  · You develop abdominal pain.  · You feel faint.  Document Released: 07/04/2005 Document Revised: 01/03/2012 Document Reviewed: 11/22/2010  [employer]® Patient Information ©2014 [employer].

## 2013-01-11 NOTE — Assessment & Plan Note (Signed)
Start motrin, continue oxycodone as needed

## 2013-01-11 NOTE — Progress Notes (Signed)
Subjective:    Patient ID: David Shelton, male    DOB: 01/19/1971, 42 y.o.   MRN: [id number]  Back Pain This is a new problem. Episode onset: 5 days ago. The problem occurs constantly. The problem is unchanged. The pain is present in the lumbar spine. The quality of the pain is described as shooting and stabbing. The pain radiates to the right thigh. The pain is at a severity of 6/10. The pain is moderate. The pain is worse during the day. The symptoms are aggravated by position, bending and standing. Stiffness is present all day. Associated symptoms include numbness (in his right foot). Pertinent negatives include no abdominal pain, bladder incontinence, bowel incontinence, chest pain, dysuria, fever, headaches, leg pain, paresis, paresthesias, pelvic pain, perianal numbness, tingling, weakness or weight loss. He has tried analgesics for the symptoms. The treatment provided mild relief.      Review of Systems  Constitutional: Negative.  Negative for fever and weight loss.  HENT: Negative.   Eyes: Negative.   Respiratory: Negative.  Negative for cough, chest tightness, shortness of breath, wheezing and stridor.   Cardiovascular: Negative.  Negative for chest pain, palpitations and leg swelling.  Gastrointestinal: Negative.  Negative for abdominal pain and bowel incontinence.  Endocrine: Negative.   Genitourinary: Negative.  Negative for bladder incontinence, dysuria and pelvic pain.  Musculoskeletal: Positive for back pain and arthralgias (right knee pain and swelling for 3 weeks). Negative for myalgias, joint swelling and gait problem.  Skin: Negative.   Allergic/Immunologic: Negative.   Neurological: Positive for numbness (in his right foot). Negative for tingling, weakness, headaches and paresthesias.  Hematological: Negative.  Negative for adenopathy. Does not bruise/bleed easily.  Psychiatric/Behavioral: Negative.        Objective:   Physical Exam  Vitals reviewed. Constitutional:  He is oriented to person, place, and time. He appears well-developed and well-nourished. No distress.  HENT:  Head: Normocephalic and atraumatic.  Mouth/Throat: Oropharynx is clear and moist. No oropharyngeal exudate.  Eyes: Conjunctivae are normal. Right eye exhibits no discharge. Left eye exhibits no discharge. No scleral icterus.  Neck: Normal range of motion. Neck supple. No JVD present. No tracheal deviation present. No thyromegaly present.  Cardiovascular: Normal rate, regular rhythm, normal heart sounds and intact distal pulses.  Exam reveals no gallop and no friction rub.   No murmur heard. Pulmonary/Chest: Effort normal and breath sounds normal. No stridor. No respiratory distress. He has no wheezes. He has no rales. He exhibits no tenderness.  Abdominal: Soft. Bowel sounds are normal. He exhibits no distension and no mass. There is no tenderness. There is no rebound and no guarding.  Musculoskeletal: He exhibits no edema and no tenderness.       Right knee: He exhibits effusion and deformity (DJD changes). He exhibits normal range of motion, no swelling, no ecchymosis, no erythema, normal alignment, no LCL laxity and no bony tenderness. No tenderness found.       Lumbar back: He exhibits decreased range of motion, tenderness and spasm. He exhibits no bony tenderness, no swelling, no edema, no deformity, no laceration, no pain and normal pulse.  Lymphadenopathy:    He has no cervical adenopathy.  Neurological: He is oriented to person, place, and time. He has normal strength. He displays no atrophy, no tremor and normal reflexes. No cranial nerve deficit or sensory deficit. He exhibits normal muscle tone. He displays a negative Romberg sign. He displays no seizure activity. Coordination and gait normal.  Reflex Scores:  Tricep reflexes are 1+ on the right side and 1+ on the left side.      Bicep reflexes are 1+ on the right side and 1+ on the left side.      Brachioradialis reflexes  are 1+ on the right side and 1+ on the left side.      Patellar reflexes are 1+ on the right side and 1+ on the left side.      Achilles reflexes are 1+ on the right side and 1+ on the left side. Neg SLR in BLE  Skin: Skin is warm and dry. No rash noted. He is not diaphoretic. No erythema. No pallor.  Psychiatric: He has a normal mood and affect. His behavior is normal. Judgment and thought content normal.      Lab Results  Component Value Date   WBC 6.6 06/07/2012   HGB 14.8 06/07/2012   HCT 41.0 06/07/2012   PLT 201 06/07/2012   GLUCOSE 97 09/24/2012   CHOL 130 09/24/2012   TRIG 151.0* 09/24/2012   HDL 45.70 09/24/2012   LDLCALC 54 09/24/2012   ALT 15 06/07/2012   AST 17 06/07/2012   NA 139 09/24/2012   K 4.0 09/24/2012   CL 104 09/24/2012   CREATININE 0.8 09/24/2012   BUN 13 09/24/2012   CO2 28 09/24/2012   TSH 0.54 09/24/2012   INR 1.08 05/12/2012   HGBA1C 5.1 09/24/2012      Assessment & Plan:

## 2013-01-11 NOTE — Assessment & Plan Note (Addendum)
I think he has a herniated disc so I gave him an injection of depo-medrol IM He will also start nsaids and a muscle relaxer

## 2013-01-13 ENCOUNTER — Emergency Department (HOSPITAL_COMMUNITY)
Admission: EM | Admit: 2013-01-13 | Discharge: 2013-01-13 | Disposition: A | Discharge status: Home / Self Care | Payer: 59 | Attending: Emergency Medicine | Admitting: Emergency Medicine

## 2013-01-13 ENCOUNTER — Encounter (HOSPITAL_COMMUNITY): Payer: Self-pay | Admitting: *Deleted

## 2013-01-13 ENCOUNTER — Emergency Department (HOSPITAL_COMMUNITY): Payer: 59

## 2013-01-13 DIAGNOSIS — Z86718 Personal history of other venous thrombosis and embolism: Secondary | ICD-10-CM | POA: Insufficient documentation

## 2013-01-13 DIAGNOSIS — Y929 Unspecified place or not applicable: Secondary | ICD-10-CM | POA: Insufficient documentation

## 2013-01-13 DIAGNOSIS — Z79899 Other long term (current) drug therapy: Secondary | ICD-10-CM | POA: Insufficient documentation

## 2013-01-13 DIAGNOSIS — Z862 Personal history of diseases of the blood and blood-forming organs and certain disorders involving the immune mechanism: Secondary | ICD-10-CM | POA: Insufficient documentation

## 2013-01-13 DIAGNOSIS — Y9389 Activity, other specified: Secondary | ICD-10-CM | POA: Insufficient documentation

## 2013-01-13 DIAGNOSIS — M545 Low back pain, unspecified: Secondary | ICD-10-CM

## 2013-01-13 DIAGNOSIS — Z8639 Personal history of other endocrine, nutritional and metabolic disease: Secondary | ICD-10-CM | POA: Insufficient documentation

## 2013-01-13 DIAGNOSIS — Z8739 Personal history of other diseases of the musculoskeletal system and connective tissue: Secondary | ICD-10-CM | POA: Insufficient documentation

## 2013-01-13 DIAGNOSIS — F17200 Nicotine dependence, unspecified, uncomplicated: Secondary | ICD-10-CM | POA: Insufficient documentation

## 2013-01-13 DIAGNOSIS — IMO0002 Reserved for concepts with insufficient information to code with codable children: Secondary | ICD-10-CM | POA: Insufficient documentation

## 2013-01-13 DIAGNOSIS — X500XXA Overexertion from strenuous movement or load, initial encounter: Secondary | ICD-10-CM | POA: Insufficient documentation

## 2013-01-13 MED ORDER — OXYCODONE HCL 5 MG PO TABS: 5.0000 mg | ORAL_TABLET | ORAL | Status: DC | PRN

## 2013-01-13 NOTE — ED Notes (Signed)
Patient transported to MRI 

## 2013-01-13 NOTE — ED Notes (Signed)
Patient discharged with instructions and he verbalizes an understanding

## 2013-01-13 NOTE — ED Notes (Signed)
Reports having lower back pain since Monday. Went to pcp on Friday and given steroid IM, pain meds, ibuprofen and muscle relaxers but still having severe pain and spasms.

## 2013-01-13 NOTE — ED Notes (Signed)
Back from MRI.
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
## 2013-01-13 NOTE — ED Provider Notes (Signed)
History    CSN: [id number] Arrival date & time 01/13/13  1100  First MD Initiated Contact with Patient 01/13/13 1138     Chief Complaint  Patient presents with  . Back Pain   (Consider location/radiation/quality/duration/timing/severity/associated sxs/prior Treatment) HPI complains of low back pain nonradiating onset 1 week ago when he flexes at the waist to bend over and felt a pop in his back. Pain is worse with movement or changing position improved with remaining still. No incontinence. No fever no other complaint patient recently taken off of XaRELTO for DVT. He's been treated with OxyContin, muscle relaxers and ibuprofen without adequate pain relief. No other associated symptoms. Past Medical History  Diagnosis Date  . TMJ syndrome   . Arthritis   . Thyroid disease   . DVT (deep venous thrombosis)   . Anemia    Past Surgical History  Procedure Laterality Date  . Bilateral temporomandibular joint arthroplasty    . Arthroscopic repair acl     Family History  Problem Relation Age of Onset  . Arthritis Mother   . Hypertension Father   . Alcohol abuse Neg Hx   . Cancer Neg Hx   . Heart disease Neg Hx   . Hyperlipidemia Neg Hx   . Kidney disease Neg Hx   . Stroke Neg Hx   . Clotting disorder Neg Hx    History  Substance Use Topics  . Smoking status: Current Every Day Smoker    Types: Cigarettes  . Smokeless tobacco: Never Used  . Alcohol Use: No    Review of Systems  Musculoskeletal: Positive for back pain.  All other systems reviewed and are negative.    Allergies  Review of patient's allergies indicates no known allergies.  Home Medications   Current Outpatient Rx  Name  Route  Sig  Dispense  Refill  . ibuprofen (ADVIL,MOTRIN) 600 MG tablet   Oral   Take 1 tablet (600 mg total) by mouth every 6 (six) hours as needed for pain.   90 tablet   1   . methocarbamol (ROBAXIN-750) 750 MG tablet   Oral   Take 1 tablet (750 mg total) by mouth 4 (four)  times daily.   50 tablet   0   . OxyCODONE (OXYCONTIN) 40 mg T12A   Oral   Take 1 tablet (40 mg total) by mouth every 12 (twelve) hours.   60 tablet   0     Fill on or after 01/11/13    BP 139/70  Pulse 72  Temp(Src) 97.9 F (36.6 C) (Oral)  Resp 22  SpO2 97% Physical Exam  Nursing note and vitals reviewed. Constitutional: He is oriented to person, place, and time. He appears well-developed and well-nourished. He appears distressed.  Appears uncomfortable  HENT:  Head: Normocephalic and atraumatic.  Eyes: Conjunctivae are normal. Pupils are equal, round, and reactive to light.  Neck: Neck supple. No tracheal deviation present. No thyromegaly present.  Cardiovascular: Normal rate and regular rhythm.   No murmur heard. Pulmonary/Chest: Effort normal and breath sounds normal.  Abdominal: Soft. Bowel sounds are normal. He exhibits no distension. There is no tenderness.  Musculoskeletal: Normal range of motion. He exhibits no edema and no tenderness.  Neurological: He is alert and oriented to person, place, and time. He has normal reflexes. Coordination normal.  Entire spine nontender. Has extreme pain at lumbar area when changing positions IN BED DTRs symmetric bilaterally knee jerk ankle jerks toes downgoing bilaterally.  Skin: Skin is  warm and dry. No rash noted.  Psychiatric: He has a normal mood and affect.    ED Course  Procedures (including critical care time) Labs Reviewed - No data to display No results found. No diagnosis found. 2:40 PM patient is alert and ambulates without difficulty. Results for orders placed in visit on 09/24/12  T3, FREE      Result Value Range   T3, Free 3.0  2.3 - 4.2 pg/mL  T4      Result Value Range   T4, Total 6.7  5.0 - 12.5 ug/dL  TSH      Result Value Range   TSH 0.54  0.35 - 5.50 uIU/mL  BASIC METABOLIC PANEL      Result Value Range   Sodium 139  135 - 145 mEq/L   Potassium 4.0  3.5 - 5.1 mEq/L   Chloride 104  96 - 112  mEq/L   CO2 28  19 - 32 mEq/L   Glucose, Bld 97  70 - 99 mg/dL   BUN 13  6 - 23 mg/dL   Creatinine, Ser 0.8  0.4 - 1.5 mg/dL   Calcium 9.0  8.4 - 16.1 mg/dL   GFR 096.04  >54.09 mL/min  HEMOGLOBIN A1C      Result Value Range   Hemoglobin A1C 5.1  4.6 - 6.5 %  D-DIMER, QUANTITATIVE      Result Value Range   D-Dimer, Quant 0.29  0.00 - 0.48 ug/mL-FEU  LIPID PANEL      Result Value Range   Cholesterol 130  0 - 200 mg/dL   Triglycerides 811.9 (*) 0.0 - 149.0 mg/dL   HDL 14.78  >29.56 mg/dL   VLDL 21.3  0.0 - 08.6 mg/dL   LDL Cholesterol 54  0 - 99 mg/dL   Total CHOL/HDL Ratio 3     Mr Lumbar Spine Wo Contrast  01/13/2013   *RADIOLOGY REPORT*  Clinical Data: Onset of back pain 5 days ago unrelieved by pain medications.  Pain radiates into the right thigh.  MRI LUMBAR SPINE WITHOUT CONTRAST  Technique:  Multiplanar and multiecho pulse sequences of the lumbar spine were obtained without intravenous contrast.  Comparison: Lumbar MRI 09/18/2009.  Findings: The alignment is stable and near anatomic.  There is minimally progressive endplate degeneration on the right at L4-L5. There is no evidence of acute fracture.  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  The conus medullaris extends to the upper L1 level and appears normal. There are no paraspinal abnormalities. There is a 7 mm T2 hyperintense lesion inferiorly in the liver which was not previously imaged.  This is likely a cyst or hemangioma.  L1-L2:  Disc desiccation with mild bulging.  No focal disc protrusion, spinal stenosis or nerve root encroachment.  L2-L3:  Normal interspace.  L3-L4:  There is annular disc bulging with an extraforaminal annular rent on the left.  There is no focal disc protrusion or L3 nerve root encroachment.  L4-L5:  There is disc bulging with a shallow central disc protrusion, mild facet and ligamentous hypertrophy.  There is minimal narrowing of the right foramen without definite L4 nerve root  encroachment.  There is no lateral recess stenosis or L5 nerve root encroachment.  L5-S1:  Disc height and hydration are maintained.  There is a slight anterolisthesis which appears stable.  Chronic L5 pars defects are difficult to completely exclude (not definite).  There is no foraminal compromise or L5 nerve root encroachment.  IMPRESSION:  1.  Compared with the prior study, no acute findings identified. No definite right-sided nerve root encroachment is seen. 2.  Stable disc bulging at L1-L2 without resulting spinal stenosis or nerve root encroachment. 3.  Stable disc bulging at L3-L4 with a lateral foraminal annular rent on the left.  No associated nerve root encroachment. 4.  Stable central disc protrusion at L4-L5 with associated mild right foraminal stenosis. 5.  Slight anterolisthesis at L5-S1 without associated foraminal compromise or nerve root encroachment.  L5 pars defects are difficult to exclude.   Original Report Authenticated By: Carey Bullocks, M.D.    MDM  MRI scan of lumbar spine ordered as patient recently had several to wish to check for bleeding to soft tissues or herniated disc Given severe pain. Patient not given opioids in the ED as he drove himself here and is driving home. Plan prescription oxycodone 10 mg 6 tablets He is to followup with Dr. Yetta Barre tomorrow Diagnosis low back pain  Doug Sou, MD 01/13/13 1444

## 2013-01-15 ENCOUNTER — Telehealth: Payer: Self-pay

## 2013-01-16 ENCOUNTER — Telehealth: Payer: Self-pay

## 2013-01-16 MED ORDER — OXYCODONE HCL 5 MG PO TABS: 5.0000 mg | ORAL_TABLET | ORAL | Status: DC | PRN

## 2013-01-16 NOTE — Telephone Encounter (Signed)
Pt previous had 3 mos of Rx for Oxy 40mg  stating in April from PCP.

## 2013-01-16 NOTE — Telephone Encounter (Signed)
Patient called stating that he was seen in ED and had a MRI and given # 6 of Oxycodone 5mg  IR (takes 2q 4hrs). He states that the Rx given by PCP does not work and therefore he has not filled that script. He would like to know if MD would change and write a new script for Oxy 5, which has been helping. Please advise Thanks

## 2013-01-16 NOTE — Telephone Encounter (Addendum)
Patient notified per MD. New Rx printed and picked up

## 2013-01-16 NOTE — Telephone Encounter (Signed)
Question - he is saying that a medicine did not work but also says that he never got the prescription for that medicine filled? Does that make sense to you?

## 2013-01-16 NOTE — Telephone Encounter (Signed)
He will have to bring those prescription back to [place]

## 2013-01-17 NOTE — Telephone Encounter (Signed)
Error

## 2013-02-01 ENCOUNTER — Ambulatory Visit: Payer: 59 | Admitting: Internal Medicine

## 2013-02-04 ENCOUNTER — Ambulatory Visit (INDEPENDENT_AMBULATORY_CARE_PROVIDER_SITE_OTHER): Payer: 59 | Admitting: Internal Medicine

## 2013-02-04 ENCOUNTER — Telehealth: Payer: Self-pay | Admitting: *Deleted

## 2013-02-04 ENCOUNTER — Encounter: Payer: Self-pay | Admitting: Internal Medicine

## 2013-02-04 VITALS — BP 138/72 | HR 72 | Temp 98.0°F | Resp 16 | Wt 194.0 lb

## 2013-02-04 DIAGNOSIS — M5116 Intervertebral disc disorders with radiculopathy, lumbar region: Secondary | ICD-10-CM

## 2013-02-04 DIAGNOSIS — I82401 Acute embolism and thrombosis of unspecified deep veins of right lower extremity: Secondary | ICD-10-CM

## 2013-02-04 DIAGNOSIS — M19079 Primary osteoarthritis, unspecified ankle and foot: Secondary | ICD-10-CM

## 2013-02-04 DIAGNOSIS — I82409 Acute embolism and thrombosis of unspecified deep veins of unspecified lower extremity: Secondary | ICD-10-CM

## 2013-02-04 DIAGNOSIS — M5126 Other intervertebral disc displacement, lumbar region: Secondary | ICD-10-CM

## 2013-02-04 DIAGNOSIS — F17200 Nicotine dependence, unspecified, uncomplicated: Secondary | ICD-10-CM

## 2013-02-04 DIAGNOSIS — Z720 Tobacco use: Secondary | ICD-10-CM

## 2013-02-04 MED ORDER — OXYCODONE HCL 5 MG PO TABS: 5.0000 mg | ORAL_TABLET | ORAL | Status: DC | PRN

## 2013-02-04 NOTE — Telephone Encounter (Signed)
done

## 2013-02-04 NOTE — Progress Notes (Signed)
Subjective:    Patient ID: David Shelton, male    DOB: 12-12-1970, 42 y.o.   MRN: [id number]  Back Pain This is a recurrent problem. The current episode started 1 to 4 weeks ago. The problem occurs intermittently. The problem has been gradually improving since onset. The pain is present in the lumbar spine. The quality of the pain is described as aching. The pain does not radiate. The pain is at a severity of 2/10. The pain is mild. The pain is worse during the day. The symptoms are aggravated by bending, position and sitting. Stiffness is present in the morning. Pertinent negatives include no abdominal pain, bladder incontinence, bowel incontinence, chest pain, dysuria, fever, headaches, leg pain, numbness, paresis, paresthesias, pelvic pain, perianal numbness, tingling, weakness or weight loss. He has tried analgesics and NSAIDs for the symptoms. The treatment provided significant relief.      Review of Systems  Constitutional: Negative.  Negative for fever and weight loss.  HENT: Negative.   Eyes: Negative.   Respiratory: Negative.   Cardiovascular: Negative.  Negative for chest pain.  Gastrointestinal: Negative.  Negative for nausea, vomiting, abdominal pain, diarrhea, constipation and bowel incontinence.  Endocrine: Negative.   Genitourinary: Negative.  Negative for bladder incontinence, dysuria and pelvic pain.  Musculoskeletal: Positive for back pain and arthralgias (ankle). Negative for myalgias, joint swelling and gait problem.  Skin: Negative.   Allergic/Immunologic: Negative.   Neurological: Negative.  Negative for dizziness, tingling, weakness, numbness, headaches and paresthesias.  Hematological: Negative.  Negative for adenopathy. Does not bruise/bleed easily.  Psychiatric/Behavioral: Negative.        Objective:   Physical Exam  Vitals reviewed. Constitutional: He is oriented to person, place, and time. He appears well-developed and well-nourished. No distress.  HENT:   Head: Normocephalic and atraumatic.  Mouth/Throat: Oropharynx is clear and moist. No oropharyngeal exudate.  Eyes: Conjunctivae are normal. Right eye exhibits no discharge. Left eye exhibits no discharge. No scleral icterus.  Neck: Normal range of motion. Neck supple. No JVD present. No tracheal deviation present. No thyromegaly present.  Cardiovascular: Normal rate, regular rhythm, normal heart sounds and intact distal pulses.  Exam reveals no gallop and no friction rub.   No murmur heard. Pulmonary/Chest: Effort normal and breath sounds normal. No stridor. No respiratory distress. He has no wheezes. He has no rales. He exhibits no tenderness.  Abdominal: Soft. Bowel sounds are normal. He exhibits no distension and no mass. There is no tenderness. There is no rebound and no guarding.  Musculoskeletal: Normal range of motion. He exhibits no edema and no tenderness.       Lumbar back: Normal. He exhibits normal range of motion, no tenderness, no bony tenderness, no swelling, no edema, no deformity, no laceration, no pain, no spasm and normal pulse.  Lymphadenopathy:    He has no cervical adenopathy.  Neurological: He is alert and oriented to person, place, and time. He has normal strength. He displays no atrophy, no tremor and normal reflexes. No cranial nerve deficit or sensory deficit. He exhibits normal muscle tone. He displays a negative Romberg sign. He displays no seizure activity. Coordination and gait normal.  Reflex Scores:      Tricep reflexes are 1+ on the right side and 1+ on the left side.      Bicep reflexes are 1+ on the right side and 1+ on the left side.      Brachioradialis reflexes are 1+ on the right side and 1+ on the left side.  Patellar reflexes are 1+ on the right side and 1+ on the left side.      Achilles reflexes are 1+ on the right side and 1+ on the left side. Neg SLR in BLE  Skin: Skin is warm and dry. No rash noted. He is not diaphoretic. No erythema. No  pallor.  Psychiatric: He has a normal mood and affect. His behavior is normal. Judgment and thought content normal.     Lab Results  Component Value Date   WBC 6.6 06/07/2012   HGB 14.8 06/07/2012   HCT 41.0 06/07/2012   PLT 201 06/07/2012   GLUCOSE 97 09/24/2012   CHOL 130 09/24/2012   TRIG 151.0* 09/24/2012   HDL 45.70 09/24/2012   LDLCALC 54 09/24/2012   ALT 15 06/07/2012   AST 17 06/07/2012   NA 139 09/24/2012   K 4.0 09/24/2012   CL 104 09/24/2012   CREATININE 0.8 09/24/2012   BUN 13 09/24/2012   CO2 28 09/24/2012   TSH 0.54 09/24/2012   INR 1.08 05/12/2012   HGBA1C 5.1 09/24/2012       Assessment & Plan:

## 2013-02-04 NOTE — Assessment & Plan Note (Signed)
He will continue the current meds for pain relief

## 2013-02-04 NOTE — Telephone Encounter (Signed)
Pt called requesting a refill on Percocet IR 5 mg #100.

## 2013-02-04 NOTE — Telephone Encounter (Signed)
Pt has an appt with Dr. Yetta Barre 05/07/13.

## 2013-02-04 NOTE — Assessment & Plan Note (Signed)
He agrees to try to quit smoking 

## 2013-02-04 NOTE — Telephone Encounter (Signed)
He needs to be seen every 3-4 months as long as I am prescribing pain meds for him

## 2013-02-04 NOTE — Assessment & Plan Note (Signed)
Will check a hypercoag panel to see if he has any ongoing risk for clotting

## 2013-02-04 NOTE — Telephone Encounter (Signed)
Pt just have an appt this moring, computer was down, so pt was wondering when Dr. Yetta Barre wants to see him back or does he need to come in for a follow up at all? Please advise.

## 2013-02-04 NOTE — Assessment & Plan Note (Signed)
Improvement noted He will continue the current meds for pain

## 2013-03-04 ENCOUNTER — Telehealth: Payer: Self-pay

## 2013-03-04 DIAGNOSIS — M5116 Intervertebral disc disorders with radiculopathy, lumbar region: Secondary | ICD-10-CM

## 2013-03-04 MED ORDER — OXYCODONE HCL 5 MG PO TABS: 5.0000 mg | ORAL_TABLET | ORAL | Status: DC | PRN

## 2013-03-04 NOTE — Telephone Encounter (Signed)
ok 

## 2013-03-04 NOTE — Telephone Encounter (Signed)
Patient called [facility] requesting refill for Oxy 5 mg #100, medication last filled 02/04/13 and pt last seen 02/04/13. Thanks

## 2013-03-04 NOTE — Telephone Encounter (Signed)
RX printed, pending MD signature and pt notified to pick up this afternoon.

## 2013-04-03 ENCOUNTER — Telehealth: Payer: Self-pay

## 2013-04-03 DIAGNOSIS — M5116 Intervertebral disc disorders with radiculopathy, lumbar region: Secondary | ICD-10-CM

## 2013-04-03 MED ORDER — OXYCODONE HCL 5 MG PO TABS: 5.0000 mg | ORAL_TABLET | ORAL | Status: DC | PRN

## 2013-04-03 NOTE — Telephone Encounter (Signed)
Patient called [facility] requesting med refill for Oxy IR 5mg  #100. Pt next appt is 05/07/13. Thanks

## 2013-05-07 ENCOUNTER — Ambulatory Visit (INDEPENDENT_AMBULATORY_CARE_PROVIDER_SITE_OTHER): Payer: 59 | Admitting: Internal Medicine

## 2013-05-07 ENCOUNTER — Ambulatory Visit (INDEPENDENT_AMBULATORY_CARE_PROVIDER_SITE_OTHER): Payer: 59

## 2013-05-07 ENCOUNTER — Encounter: Payer: Self-pay | Admitting: Internal Medicine

## 2013-05-07 VITALS — BP 120/70 | HR 72 | Temp 97.0°F | Resp 16 | Ht 72.0 in | Wt 201.0 lb

## 2013-05-07 DIAGNOSIS — M5116 Intervertebral disc disorders with radiculopathy, lumbar region: Secondary | ICD-10-CM

## 2013-05-07 DIAGNOSIS — I82409 Acute embolism and thrombosis of unspecified deep veins of unspecified lower extremity: Secondary | ICD-10-CM

## 2013-05-07 DIAGNOSIS — R946 Abnormal results of thyroid function studies: Secondary | ICD-10-CM

## 2013-05-07 DIAGNOSIS — R209 Unspecified disturbances of skin sensation: Secondary | ICD-10-CM

## 2013-05-07 DIAGNOSIS — R200 Anesthesia of skin: Secondary | ICD-10-CM

## 2013-05-07 DIAGNOSIS — I82401 Acute embolism and thrombosis of unspecified deep veins of right lower extremity: Secondary | ICD-10-CM

## 2013-05-07 DIAGNOSIS — R7989 Other specified abnormal findings of blood chemistry: Secondary | ICD-10-CM

## 2013-05-07 DIAGNOSIS — M19079 Primary osteoarthritis, unspecified ankle and foot: Secondary | ICD-10-CM

## 2013-05-07 DIAGNOSIS — M5126 Other intervertebral disc displacement, lumbar region: Secondary | ICD-10-CM

## 2013-05-07 LAB — CBC WITH DIFFERENTIAL/PLATELET
Basophils Absolute: 0 10*3/uL (ref 0.0–0.1)
Eosinophils Absolute: 0.2 10*3/uL (ref 0.0–0.7)
MCHC: 34.9 g/dL (ref 30.0–36.0)
MCV: 99.2 fl (ref 78.0–100.0)
Monocytes Absolute: 0.4 10*3/uL (ref 0.1–1.0)
Neutro Abs: 3.3 10*3/uL (ref 1.4–7.7)
Neutrophils Relative %: 59 % (ref 43.0–77.0)
Platelets: 216 10*3/uL (ref 150.0–400.0)
RBC: 4.3 Mil/uL (ref 4.22–5.81)
WBC: 5.6 10*3/uL (ref 4.5–10.5)

## 2013-05-07 LAB — COMPREHENSIVE METABOLIC PANEL
AST: 24 U/L (ref 0–37)
Albumin: 4.1 g/dL (ref 3.5–5.2)
Alkaline Phosphatase: 67 U/L (ref 39–117)
CO2: 28 mEq/L (ref 19–32)
Potassium: 3.8 mEq/L (ref 3.5–5.1)
Sodium: 138 mEq/L (ref 135–145)
Total Protein: 6.7 g/dL (ref 6.0–8.3)

## 2013-05-07 LAB — TSH: TSH: 0.76 u[IU]/mL (ref 0.35–5.50)

## 2013-05-07 MED ORDER — OXYCODONE HCL 5 MG PO TABS: 5.0000 mg | ORAL_TABLET | ORAL | Status: DC | PRN

## 2013-05-07 NOTE — Progress Notes (Signed)
Subjective:    Patient ID: David Shelton, male    DOB: Dec 02, 1970, 42 y.o.   MRN: [id number]  HPI Comments: Today he complains of pain and swelling in his right calf for 2-3 weeks and he is concerned that there is another blood clot.  Arthritis Presents for follow-up visit. The disease course has been fluctuating. He complains of pain. He reports no stiffness, joint swelling or joint warmth. The symptoms have been stable. Affected locations include the right ankle. His pain is at a severity of 6/10 ("I can't get thru the day without oxy"). Associated symptoms include pain at night and pain while resting. Pertinent negatives include no diarrhea, dry eyes, dry mouth, dysuria, fatigue or fever. His past medical history is significant for osteoarthritis. Past treatments include an opioid. The treatment provided moderate relief. Factors aggravating his arthritis include activity. Compliance with prior treatments has been good.      Review of Systems  Constitutional: Negative.  Negative for fever, chills, diaphoresis, appetite change and fatigue.  HENT: Negative.   Eyes: Negative.   Respiratory: Negative.  Negative for apnea, cough, chest tightness, shortness of breath, wheezing and stridor.   Cardiovascular: Negative.  Negative for chest pain, palpitations and leg swelling.  Gastrointestinal: Negative.  Negative for nausea, vomiting, abdominal pain, diarrhea, constipation and blood in stool.  Endocrine: Negative.   Genitourinary: Negative.  Negative for dysuria.  Musculoskeletal: Positive for arthralgias and arthritis. Negative for back pain, gait problem, joint swelling, myalgias, neck pain, neck stiffness and stiffness.  Skin: Negative.   Allergic/Immunologic: Negative.   Neurological: Negative.  Negative for dizziness and light-headedness.  Hematological: Negative.  Negative for adenopathy. Does not bruise/bleed easily.  Psychiatric/Behavioral: Negative.        Objective:   Physical Exam   Vitals reviewed. Constitutional: He is oriented to person, place, and time. He appears well-developed and well-nourished. No distress.  HENT:  Head: Normocephalic and atraumatic.  Mouth/Throat: Oropharynx is clear and moist. No oropharyngeal exudate.  Eyes: Conjunctivae are normal. Right eye exhibits no discharge. Left eye exhibits no discharge. No scleral icterus.  Neck: Normal range of motion. Neck supple. No JVD present. No tracheal deviation present. No thyromegaly present.  Cardiovascular: Normal rate, regular rhythm, normal heart sounds and intact distal pulses.  Exam reveals no gallop and no friction rub.   No murmur heard. Pulmonary/Chest: Effort normal and breath sounds normal. No stridor. No respiratory distress. He has no wheezes. He has no rales. He exhibits no tenderness.  Abdominal: Soft. Bowel sounds are normal. He exhibits no distension and no mass. There is no tenderness. There is no rebound and no guarding.  Musculoskeletal: Normal range of motion. He exhibits no edema and no tenderness.       Right ankle: Normal. He exhibits normal range of motion, no swelling, no ecchymosis, no deformity, no laceration and normal pulse. No tenderness. Achilles tendon normal. Achilles tendon exhibits no pain, no defect and normal Thompson's test results.       Right lower leg: Normal. He exhibits no tenderness, no bony tenderness, no swelling, no edema, no deformity and no laceration.  Lymphadenopathy:    He has no cervical adenopathy.  Neurological: He is oriented to person, place, and time.  Skin: Skin is warm and dry. No rash noted. He is not diaphoretic. No erythema. No pallor.  Psychiatric: He has a normal mood and affect. His behavior is normal. Judgment and thought content normal.     Lab Results  Component Value Date  WBC 6.6 06/07/2012   HGB 14.8 06/07/2012   HCT 41.0 06/07/2012   PLT 201 06/07/2012   GLUCOSE 97 09/24/2012   CHOL 130 09/24/2012   TRIG 151.0* 09/24/2012   HDL  45.70 09/24/2012   LDLCALC 54 09/24/2012   ALT 15 06/07/2012   AST 17 06/07/2012   NA 139 09/24/2012   K 4.0 09/24/2012   CL 104 09/24/2012   CREATININE 0.8 09/24/2012   BUN 13 09/24/2012   CO2 28 09/24/2012   TSH 0.54 09/24/2012   INR 1.08 05/12/2012   HGBA1C 5.1 09/24/2012       Assessment & Plan:

## 2013-05-07 NOTE — Patient Instructions (Signed)
Deep Vein Thrombosis A deep vein thrombosis (DVT) is a blood clot that develops in a deep vein. A DVT is a clot in the deep, larger veins of the leg, arm, or pelvis. These are more dangerous than clots that might form in veins near the surface of the body. A DVT can lead to complications if the clot breaks off and travels in the bloodstream to the lungs.  A DVT can damage the valves in your leg veins, so that instead of flowing upwards, the blood pools in the lower leg. This is called post-thrombotic syndrome, and can result in pain, swelling, discoloration, and sores on the leg. Once identified, a DVT can be treated. It can also be prevented in some circumstances. Once you have had a DVT, you may be at increased risk for a DVT in the future. CAUSES Blood clots form in a vein for different reasons. Usually several things contribute to blood clots. Contributing factors include:  The flow of blood slows down.  The inside of the vein is damaged in some way.  The person has a condition that makes blood clot more easily. Some people are more likely than others to develop blood clots. That is because they have more factors that make clots likely. These are called risk factors. Risk factors include:   Older age, especially over 75 years old.  Having a history of blood clots. This means you have had one before. Or, it means that someone else in your family has had blood clots. You may have a genetic tendency to form clots.  Having major or lengthy surgery. This is especially true for surgery on the hip, knee, or belly (abdomen). Hip surgery is particularly high risk.  Breaking a hip or leg.  Sitting or lying still for a long time. This includes long distance travel, paralysis, or recovery from an illness or surgery.  Cancer, or cancer treatment.  Having a long, thin tube (catheter) placed inside a vein during a medical procedure.  Being overweight (obese).  Pregnancy and childbirth. Hormone  changes make the blood clot more easily during pregnancy. The fetus puts pressure on the veins of the pelvis. There is also risk of injury to veins during delivery or a caesarean. The risk is at its highest just after childbirth.  Medicines with the male hormone estrogen. This includes birth control pills and hormone replacement therapy.  Smoking.  Other circulation or heart problems. SYMPTOMS When a clot forms, it can either partially or totally block the blood flow in that vein. Symptoms of a DVT can include:  Swelling of the leg or arm, especially if one side is much worse.  Warmth and redness of the leg or arm, especially if one side is much worse.  Pain in an arm or leg. If the clot is in the leg, symptoms may be more noticeable or worse when standing or walking. The symptoms of a DVT that has traveled to the lungs (pulmonary embolism, PE) usually start suddenly, and include:  Shortness of breath.  Coughing.  Coughing up blood or blood-tinged phlegm.  Chest pain. The chest pain is often worse with deep breaths.  Rapid heartbeat. Anyone with these symptoms should get emergency medical treatment right away. Call your local emergency services (911 in [place] if you have these symptoms. DIAGNOSIS If a DVT is suspected, your caregiver will take a full medical history and carry out a physical exam. Tests that also may be required include:  Blood tests, including studies of   the clotting properties of the blood.  Ultrasonography to see if you have clots in your legs or lungs.  X-rays to show the flow of blood when dye is injected into the veins (venography).  Studies of your lungs, if you have any chest symptoms. PREVENTION  Exercise the legs regularly. Take a brisk 30 minute walk every day.  Maintain a weight that is appropriate for your height.  Avoid sitting or lying in bed for long periods of time without moving your legs.  Women, particularly those over the age of 35,  should consider the risks and benefits of taking estrogen medicines, including birth control pills.  Do not smoke, especially if you take estrogen medicines.  Long distance travel can increase your risk of DVT. You should exercise your legs by walking or pumping the muscles every hour.  In-hospital prevention:  Many of the risk factors above relate to situations that exist with hospitalization, either for illness, injury, or elective surgery.  Your caregiver will assess you for the need for venous thromboembolism prophylaxis when you are admitted to the hospital. If you are having surgery, your surgeon will assess you the day of or day after surgery.  Prevention may include medical and nonmedical measures. TREATMENT Treatment for DVT helps prevent death and disability. The most common treatment for DVT is blood thinning (anticoagulant) medicine, which reduces the blood's tendency to clot. Anticoagulants can stop new blood clots from forming and old ones from growing. They cannot dissolve existing clots. Your body does this by itself over time. Anticoagulants can be given by mouth, by intravenous (IV) access, or by injection. Your caregiver will determine the best program for you.  Heparin or related medicines (low molecular weight heparin) are usually the first treatment for a blood clot. They act quickly. However, they cannot be taken orally.  Heparin can cause a fall in a component of blood that stops bleeding and forms blood clots (platelets). You will be monitored with blood tests to be sure this does not occur.  Warfarin is an anticoagulant that can be swallowed (taken orally). It takes a few days to start working, so usually heparin or related medicines are used in combination. Once warfarin is working, heparin is usually stopped.  Less commonly, clot dissolving drugs (thrombolytics) are used to dissolve a DVT. They carry a high risk of bleeding, so they are used mainly in severe cases,  where a life or limb is threatened.  Very rarely, a blood clot in the leg needs to be removed surgically.  If you are unable to take anticoagulants, your caregiver may arrange for you to have a filter placed in a main vein in your belly (abdomen). This filter prevents clots from traveling to your lungs. HOME CARE INSTRUCTIONS  Take all medicines prescribed by your caregiver. Follow the directions carefully.  Warfarin. Most people will continue taking warfarin after hospital discharge. Your caregiver will advise you on the length of treatment (usually 3 6 months, sometimes lifelong).  Too much and too little warfarin are both dangerous. Too much warfarin increases the risk of bleeding. Too little warfarin continues to allow the risk for blood clots. While taking warfarin, you will need to have regular blood tests to measure your blood clotting time. These blood tests usually include both the prothrombin time (PT) and international normalized ratio (INR) tests. The PT and INR results allow your caregiver to adjust your dose of warfarin. The dose can change for many reasons. It is critically important that   you take warfarin exactly as prescribed, and that you have your PT and INR levels drawn exactly as directed.  Many foods, especially foods high in vitamin K can interfere with warfarin and affect the PT and INR results. Foods high in vitamin K include spinach, kale, broccoli, cabbage, collard and turnip greens, brussels sprouts, peas, cauliflower, seaweed, and parsley as well as beef and pork liver, green tea, and soybean oil. You should eat a consistent amount of foods high in vitamin K. Avoid major changes in your diet, or notify your caregiver before changing your diet. Arrange a visit with a dietitian to answer your questions.  Many medicines can interfere with warfarin and affect the PT and INR results. You must tell your caregiver about any and all medicines you take, this includes all vitamins  and supplements. Be especially cautious with aspirin and anti-inflammatory medicines. Ask your caregiver before taking these. Do not take or discontinue any prescribed or over-the-counter medicine except on the advice of your caregiver or pharmacist.  Warfarin can have side effects, primarily excessive bruising or bleeding. You will need to hold pressure over cuts for longer than usual. Your caregiver or pharmacist will discuss other potential side effects.  Alcohol can change the body's ability to handle warfarin. It is best to avoid alcoholic drinks or consume only very small amounts while taking warfarin. Notify your caregiver if you change your alcohol intake.  Notify your dentist or other caregivers before procedures.  Activity. Ask your caregiver how soon you can go back to normal activities. It is important to stay active to prevent blood clots. If you are on anticoagulant medicine, avoid contact sports.  Exercise. It is very important to exercise. This is especially important while traveling, sitting or standing for long periods of time. Exercise your legs by walking or by pumping the muscles frequently. Take frequent walks.  Compression stockings. These are tight elastic stockings that apply pressure to the lower legs. This pressure can help keep the blood in the legs from clotting. You may need to wear compressions stockings at home to help prevent a DVT.  Smoking. If you smoke, quit. Ask your caregiver for help with quitting smoking.  Learn as much as you can about DVT. Knowing more about the condition should help you keep it from coming back.  Wear a medical alert bracelet or carry a medical alert card. SEEK MEDICAL CARE IF:  You notice a rapid heartbeat.  You feel weaker or more tired than usual.  You feel faint.  You notice increased bruising.  You feel your symptoms are not getting better in the time expected.  You believe you are having side effects of medicine. SEEK  IMMEDIATE MEDICAL CARE IF:  You have chest pain.  You have trouble breathing.  You have new or increased swelling or pain in one leg.  You cough up blood.  You notice blood in vomit, in a bowel movement, or in urine. MAKE SURE YOU:  Understand these instructions.  Will watch your condition.  Will get help right away if you are not doing well or get worse. Document Released: 07/04/2005 Document Revised: 03/28/2012 Document Reviewed: 08/26/2010 [employer] Patient Information 2014 [employer].  

## 2013-05-07 NOTE — Assessment & Plan Note (Signed)
The exam is normal today I will check a D-dimer and an U/S to see if there is recurrent DVT I have also ordered a hypercoag panel to see if he has risk for recurrent clotting

## 2013-05-07 NOTE — Assessment & Plan Note (Signed)
He will continue oxycodone for pain relief

## 2013-05-07 NOTE — Assessment & Plan Note (Signed)
I will recheck his TFT's today 

## 2013-05-08 LAB — D-DIMER, QUANTITATIVE (NOT AT ARMC): D-Dimer, Quant: 0.27 ug/mL-FEU (ref 0.00–0.48)

## 2013-05-10 LAB — HYPERCOAGULABLE PANEL, COMPREHENSIVE
AntiThromb III Func: 102 % (ref 76–126)
Anticardiolipin IgM: 15 MPL U/mL — ABNORMAL HIGH (ref <11)
Beta-2 Glyco I IgG: 1 G Units (ref <20)
Beta-2-Glycoprotein I IgM: 46 M Units — ABNORMAL HIGH (ref <20)
DRVVT: 29.7 secs (ref <42.9)
Lupus Anticoagulant: NOT DETECTED
PTT Lupus Anticoagulant: 33.9 secs (ref 28.0–43.0)
Protein C Activity: 101 % (ref 75–133)
Protein S Activity: 99 % (ref 69–129)

## 2013-05-12 ENCOUNTER — Encounter: Payer: Self-pay | Admitting: Internal Medicine

## 2013-05-16 ENCOUNTER — Ambulatory Visit (HOSPITAL_COMMUNITY): Payer: 59 | Attending: Internal Medicine

## 2013-05-16 DIAGNOSIS — M79609 Pain in unspecified limb: Secondary | ICD-10-CM

## 2013-05-16 DIAGNOSIS — Z86718 Personal history of other venous thrombosis and embolism: Secondary | ICD-10-CM | POA: Insufficient documentation

## 2013-05-16 DIAGNOSIS — M7989 Other specified soft tissue disorders: Secondary | ICD-10-CM

## 2013-05-16 DIAGNOSIS — I82401 Acute embolism and thrombosis of unspecified deep veins of right lower extremity: Secondary | ICD-10-CM

## 2013-05-23 ENCOUNTER — Other Ambulatory Visit (HOSPITAL_COMMUNITY): Payer: 59

## 2013-07-02 ENCOUNTER — Telehealth: Payer: Self-pay | Admitting: *Deleted

## 2013-07-02 MED ORDER — DOXEPIN HCL 6 MG PO TABS: 1.0000 | ORAL_TABLET | Freq: Every evening | ORAL | Status: DC | PRN

## 2013-07-02 NOTE — Telephone Encounter (Signed)
Start silenor for the insomnia Lab letter was sent way back in October - I guess he did not get the letter so I will resend it today, there was no evidence of a blood clot  T. Yetta Barre

## 2013-07-02 NOTE — Telephone Encounter (Signed)
Patient called and stated he would like something for insomnia. This has been going on for about two months now. Please advise. Patient would also like a call back with his most recent lab results. JG//CMA

## 2013-07-02 NOTE — Telephone Encounter (Signed)
Called and spoke with patient to let him know that a prescription was sent to his pharmacy for insomnia and that a letter of his lab results will be re-sent to him, US revealed no evidence of blood clot. JG//CMA

## 2013-08-12 ENCOUNTER — Telehealth: Payer: Self-pay

## 2013-08-12 NOTE — Telephone Encounter (Signed)
Called patient to schedule, no answer, lvmom.

## 2013-08-12 NOTE — Telephone Encounter (Signed)
The patient called and is in need of his pain medication to be refilled. D

## 2013-08-12 NOTE — Telephone Encounter (Signed)
Patient must be seen every three months in order to receive refills on controls. Message routed to scheduling to set up appointment.

## 2013-08-14 ENCOUNTER — Encounter: Payer: Self-pay | Admitting: Internal Medicine

## 2013-08-14 ENCOUNTER — Ambulatory Visit (INDEPENDENT_AMBULATORY_CARE_PROVIDER_SITE_OTHER): Payer: 59 | Admitting: Internal Medicine

## 2013-08-14 VITALS — BP 136/78 | HR 88 | Temp 98.5°F | Resp 16 | Ht 72.0 in | Wt 201.0 lb

## 2013-08-14 DIAGNOSIS — M25469 Effusion, unspecified knee: Secondary | ICD-10-CM

## 2013-08-14 DIAGNOSIS — M19079 Primary osteoarthritis, unspecified ankle and foot: Secondary | ICD-10-CM

## 2013-08-14 DIAGNOSIS — IMO0002 Reserved for concepts with insufficient information to code with codable children: Secondary | ICD-10-CM

## 2013-08-14 DIAGNOSIS — M5126 Other intervertebral disc displacement, lumbar region: Secondary | ICD-10-CM

## 2013-08-14 DIAGNOSIS — M25569 Pain in unspecified knee: Secondary | ICD-10-CM

## 2013-08-14 DIAGNOSIS — M25461 Effusion, right knee: Secondary | ICD-10-CM

## 2013-08-14 DIAGNOSIS — M5116 Intervertebral disc disorders with radiculopathy, lumbar region: Secondary | ICD-10-CM

## 2013-08-14 DIAGNOSIS — M5416 Radiculopathy, lumbar region: Secondary | ICD-10-CM

## 2013-08-14 DIAGNOSIS — M25561 Pain in right knee: Secondary | ICD-10-CM | POA: Insufficient documentation

## 2013-08-14 MED ORDER — OXYCODONE HCL 5 MG PO TABS: 5.0000 mg | ORAL_TABLET | ORAL | Status: DC | PRN

## 2013-08-14 MED ORDER — IBUPROFEN 600 MG PO TABS: 600.0000 mg | ORAL_TABLET | Freq: Four times a day (QID) | ORAL | Status: DC | PRN

## 2013-08-14 NOTE — Progress Notes (Signed)
Pre visit review using our clinic review tool, if applicable. No additional management support is needed unless otherwise documented below in the visit note. 

## 2013-08-14 NOTE — Patient Instructions (Signed)
Knee Exercises EXERCISES RANGE OF MOTION(ROM) AND STRETCHING EXERCISES These exercises may help you when beginning to rehabilitate your injury. Your symptoms may resolve with or without further involvement from your physician, physical therapist or athletic trainer. While completing these exercises, remember:   Restoring tissue flexibility helps normal motion to return to the joints. This allows healthier, less painful movement and activity.  An effective stretch should be held for at least 30 seconds.  A stretch should never be painful. You should only feel a gentle lengthening or release in the stretched tissue. STRETCH - Knee Extension, Prone  Lie on your stomach on a firm surface, such as a bed or countertop. Place your right / left knee and leg just beyond the edge of the surface. You may wish to place a towel under the far end of your right / left thigh for comfort.  Relax your leg muscles and allow gravity to straighten your knee. Your clinician may advise you to add an ankle weight if more resistance is helpful for you.  You should feel a stretch in the back of your right / left knee. Hold this position for __________ seconds. Repeat __________ times. Complete this stretch __________ times per day. * Your physician, physical therapist or athletic trainer may ask you to add ankle weight to enhance your stretch.  RANGE OF MOTION - Knee Flexion, Active  Lie on your back with both knees straight. (If this causes back discomfort, bend your opposite knee, placing your foot flat on the floor.)  Slowly slide your heel back toward your buttocks until you feel a gentle stretch in the front of your knee or thigh.  Hold for __________ seconds. Slowly slide your heel back to the starting position. Repeat __________ times. Complete this exercise __________ times per day.  STRETCH - Quadriceps, Prone   Lie on your stomach on a firm surface, such as a bed or padded floor.  Bend your right /  left knee and grasp your ankle. If you are unable to reach, your ankle or pant leg, use a belt around your foot to lengthen your reach.  Gently pull your heel toward your buttocks. Your knee should not slide out to the side. You should feel a stretch in the front of your thigh and/or knee.  Hold this position for __________ seconds. Repeat __________ times. Complete this stretch __________ times per day.  STRETCH  Hamstrings, Supine   Lie on your back. Loop a belt or towel over the ball of your right / left foot.  Straighten your right / left knee and slowly pull on the belt to raise your leg. Do not allow the right / left knee to bend. Keep your opposite leg flat on the floor.  Raise the leg until you feel a gentle stretch behind your right / left knee or thigh. Hold this position for __________ seconds. Repeat __________ times. Complete this stretch __________ times per day.  STRENGTHENING EXERCISES These exercises may help you when beginning to rehabilitate your injury. They may resolve your symptoms with or without further involvement from your physician, physical therapist or athletic trainer. While completing these exercises, remember:   Muscles can gain both the endurance and the strength needed for everyday activities through controlled exercises.  Complete these exercises as instructed by your physician, physical therapist or athletic trainer. Progress the resistance and repetitions only as guided.  You may experience muscle soreness or fatigue, but the pain or discomfort you are trying to eliminate should   never worsen during these exercises. If this pain does worsen, stop and make certain you are following the directions exactly. If the pain is still present after adjustments, discontinue the exercise until you can discuss the trouble with your clinician. STRENGTH - Quadriceps, Isometrics  Lie on your back with your right / left leg extended and your opposite knee bent.  Gradually  tense the muscles in the front of your right / left thigh. You should see either your knee cap slide up toward your hip or increased dimpling just above the knee. This motion will push the back of the knee down toward the floor/mat/bed on which you are lying.  Hold the muscle as tight as you can without increasing your pain for __________ seconds.  Relax the muscles slowly and completely in between each repetition. Repeat __________ times. Complete this exercise __________ times per day.  STRENGTH - Quadriceps, Short Arcs   Lie on your back. Place a __________ inch towel roll under your knee so that the knee slightly bends.  Raise only your lower leg by tightening the muscles in the front of your thigh. Do not allow your thigh to rise.  Hold this position for __________ seconds. Repeat __________ times. Complete this exercise __________ times per day.  OPTIONAL ANKLE WEIGHTS: Begin with ____________________, but DO NOT exceed ____________________. Increase in 1 pound/0.5 kilogram increments.  STRENGTH - Quadriceps, Straight Leg Raises  Quality counts! Watch for signs that the quadriceps muscle is working to insure you are strengthening the correct muscles and not "cheating" by substituting with healthier muscles.  Lay on your back with your right / left leg extended and your opposite knee bent.  Tense the muscles in the front of your right / left thigh. You should see either your knee cap slide up or increased dimpling just above the knee. Your thigh may even quiver.  Tighten these muscles even more and raise your leg 4 to 6 inches off the floor. Hold for __________ seconds.  Keeping these muscles tense, lower your leg.  Relax the muscles slowly and completely in between each repetition. Repeat __________ times. Complete this exercise __________ times per day.  STRENGTH - Hamstring, Curls  Lay on your stomach with your legs extended. (If you lay on a bed, your feet may hang over the  edge.)  Tighten the muscles in the back of your thigh to bend your right / left knee up to 90 degrees. Keep your hips flat on the bed/floor.  Hold this position for __________ seconds.  Slowly lower your leg back to the starting position. Repeat __________ times. Complete this exercise __________ times per day.  OPTIONAL ANKLE WEIGHTS: Begin with ____________________, but DO NOT exceed ____________________. Increase in 1 pound/0.5 kilogram increments.  STRENGTH  Quadriceps, Squats  Stand in a door frame so that your feet and knees are in line with the frame.  Use your hands for balance, not support, on the frame.  Slowly lower your weight, bending at the hips and knees. Keep your lower legs upright so that they are parallel with the door frame. Squat only within the range that does not increase your knee pain. Never let your hips drop below your knees.  Slowly return upright, pushing with your legs, not pulling with your hands. Repeat __________ times. Complete this exercise __________ times per day.  STRENGTH - Quadriceps, Wall Slides  Follow guidelines for form closely. Increased knee pain often results from poorly placed feet or knees.  Lean against   a smooth wall or door and walk your feet out 18-24 inches. Place your feet hip-width apart.  Slowly slide down the wall or door until your knees bend __________ degrees.* Keep your knees over your heels, not your toes, and in line with your hips, not falling to either side.  Hold for __________ seconds. Stand up to rest for __________ seconds in between each repetition. Repeat __________ times. Complete this exercise __________ times per day. * Your physician, physical therapist or athletic trainer will alter this angle based on your symptoms and progress. Document Released: 05/18/2005 Document Revised: 09/26/2011 Document Reviewed: 10/16/2008 [employer] Patient Information 2014 [employer].  

## 2013-08-14 NOTE — Assessment & Plan Note (Signed)
He will continue oxycodone as needed for pain, I have also asked him to add motrin as needed I have ordered an MRI to see if there has been a complication from the previous MCL repair Will also refer to sports med for further evaluation

## 2013-08-14 NOTE — Progress Notes (Signed)
Subjective:    Patient ID: David Shelton, male    DOB: 1971-02-21, 43 y.o.   MRN: [id number]  Arthritis Presents for follow-up visit. The disease course has been fluctuating. He complains of pain. He reports no stiffness, joint swelling or joint warmth. Affected locations include the right knee. His pain is at a severity of 4/10. Associated symptoms include pain at night and pain while resting. Pertinent negatives include no diarrhea, dry eyes, dry mouth, dysuria, fatigue, fever, rash, Raynaud's syndrome, uveitis or weight loss. His past medical history is significant for osteoarthritis. (MCL repair 3 years ago) His pertinent risk factors include overuse. Past treatments include an opioid. The treatment provided significant relief. Factors aggravating his arthritis include activity.      Review of Systems  Constitutional: Negative.  Negative for fever, chills, weight loss, diaphoresis, appetite change and fatigue.  HENT: Negative.   Eyes: Negative.   Respiratory: Negative.   Cardiovascular: Negative.  Negative for chest pain, palpitations and leg swelling.  Gastrointestinal: Negative.  Negative for diarrhea.  Endocrine: Negative.   Genitourinary: Negative.  Negative for dysuria.  Musculoskeletal: Positive for arthralgias, arthritis and back pain. Negative for gait problem, joint swelling, myalgias, neck pain, neck stiffness and stiffness.  Skin: Negative.  Negative for rash.  Allergic/Immunologic: Negative.   Neurological: Negative.   Hematological: Negative.  Negative for adenopathy. Does not bruise/bleed easily.  Psychiatric/Behavioral: Negative.        Objective:   Physical Exam  Vitals reviewed. Constitutional: He appears well-developed and well-nourished.  HENT:  Head: Normocephalic and atraumatic.  Mouth/Throat: Oropharynx is clear and moist. No oropharyngeal exudate.  Eyes: Conjunctivae are normal. Right eye exhibits no discharge. Left eye exhibits no discharge. No scleral  icterus.  Neck: Normal range of motion. Neck supple. No JVD present. No tracheal deviation present. No thyromegaly present.  Cardiovascular: Normal rate, regular rhythm, normal heart sounds and intact distal pulses.  Exam reveals no gallop and no friction rub.   No murmur heard. Pulmonary/Chest: Effort normal and breath sounds normal. No stridor. No respiratory distress. He has no wheezes. He has no rales. He exhibits no tenderness.  Abdominal: Soft. Bowel sounds are normal. He exhibits no distension and no mass. There is no tenderness. There is no rebound and no guarding.  Musculoskeletal: Normal range of motion. He exhibits no edema and no tenderness.       Right knee: He exhibits normal range of motion, no swelling, no effusion, no ecchymosis, no deformity, no laceration, no erythema, normal alignment, no LCL laxity, normal patellar mobility, no bony tenderness, normal meniscus and no MCL laxity. No lateral joint line, no MCL, no LCL and no patellar tendon tenderness noted.  Lymphadenopathy:    He has no cervical adenopathy.  Neurological: He is alert. He has normal strength. He displays no atrophy, no tremor and normal reflexes. No cranial nerve deficit or sensory deficit. He exhibits normal muscle tone. He displays a negative Romberg sign. He displays no seizure activity. Coordination and gait normal.  Reflex Scores:      Tricep reflexes are 1+ on the right side and 1+ on the left side.      Bicep reflexes are 1+ on the right side and 1+ on the left side.      Brachioradialis reflexes are 1+ on the right side and 1+ on the left side.      Patellar reflexes are 1+ on the right side and 1+ on the left side.  Achilles reflexes are 1+ on the right side and 1+ on the left side. Skin: Skin is warm and dry. No rash noted. He is not diaphoretic. No erythema. No pallor.     Lab Results  Component Value Date   WBC 5.6 05/07/2013   HGB 14.9 05/07/2013   HCT 42.6 05/07/2013   PLT 216.0  05/07/2013   GLUCOSE 118* 05/07/2013   CHOL 130 09/24/2012   TRIG 151.0* 09/24/2012   HDL 45.70 09/24/2012   LDLCALC 54 09/24/2012   ALT 23 05/07/2013   AST 24 05/07/2013   NA 138 05/07/2013   K 3.8 05/07/2013   CL 103 05/07/2013   CREATININE 0.8 05/07/2013   BUN 13 05/07/2013   CO2 28 05/07/2013   TSH 0.76 05/07/2013   INR 1.08 05/12/2012   HGBA1C 5.1 09/24/2012       Assessment & Plan:

## 2013-08-15 ENCOUNTER — Telehealth: Payer: Self-pay | Admitting: Internal Medicine

## 2013-08-15 NOTE — Telephone Encounter (Signed)
Relevant patient education assigned to patient using Emmi. ° °

## 2013-08-19 ENCOUNTER — Other Ambulatory Visit: Payer: 59

## 2013-08-19 ENCOUNTER — Ambulatory Visit: Payer: 59 | Admitting: Family Medicine

## 2013-08-19 DIAGNOSIS — Z0289 Encounter for other administrative examinations: Secondary | ICD-10-CM

## 2013-09-13 ENCOUNTER — Telehealth: Payer: Self-pay

## 2013-09-13 DIAGNOSIS — M19079 Primary osteoarthritis, unspecified ankle and foot: Secondary | ICD-10-CM

## 2013-09-13 DIAGNOSIS — M25561 Pain in right knee: Secondary | ICD-10-CM

## 2013-09-13 DIAGNOSIS — M5116 Intervertebral disc disorders with radiculopathy, lumbar region: Secondary | ICD-10-CM

## 2013-09-13 MED ORDER — OXYCODONE HCL 5 MG PO TABS: 5.0000 mg | ORAL_TABLET | ORAL | Status: DC | PRN

## 2013-09-13 NOTE — Telephone Encounter (Signed)
Pt notified to pick up 

## 2013-09-13 NOTE — Telephone Encounter (Signed)
The patient called and is hoping to get an rx re-printed for his pain medicine.  He states he lost his rx in his truck.  Callback - [phone]

## 2013-09-13 NOTE — Telephone Encounter (Signed)
done

## 2013-10-17 ENCOUNTER — Telehealth: Payer: Self-pay

## 2013-10-17 DIAGNOSIS — M19079 Primary osteoarthritis, unspecified ankle and foot: Secondary | ICD-10-CM

## 2013-10-17 DIAGNOSIS — M5116 Intervertebral disc disorders with radiculopathy, lumbar region: Secondary | ICD-10-CM

## 2013-10-17 DIAGNOSIS — M25561 Pain in right knee: Secondary | ICD-10-CM

## 2013-10-17 MED ORDER — OXYCODONE HCL 5 MG PO TABS: 5.0000 mg | ORAL_TABLET | ORAL | Status: DC | PRN

## 2013-10-17 NOTE — Telephone Encounter (Signed)
done

## 2013-10-17 NOTE — Telephone Encounter (Signed)
Pt notified to pick up 

## 2013-10-17 NOTE — Telephone Encounter (Signed)
The patient called and stated he needs one month of his pain medicine (patient was asked what medicine he uses, and he stated he did not know).  He is hoping for a call back when this medication is ready for pick up.    Thanks!

## 2013-11-21 IMAGING — CR DG CHEST 2V
2 series · 2 of 2 positions shown · non-contrast
Comparison: 05/15/2012.

CLINICAL DATA: History of chest pain.  Shortness of breath.  2-week
history of blood clot in his leg.  Episodes of dizziness.

CHEST - 2 VIEW

[w chest pa]
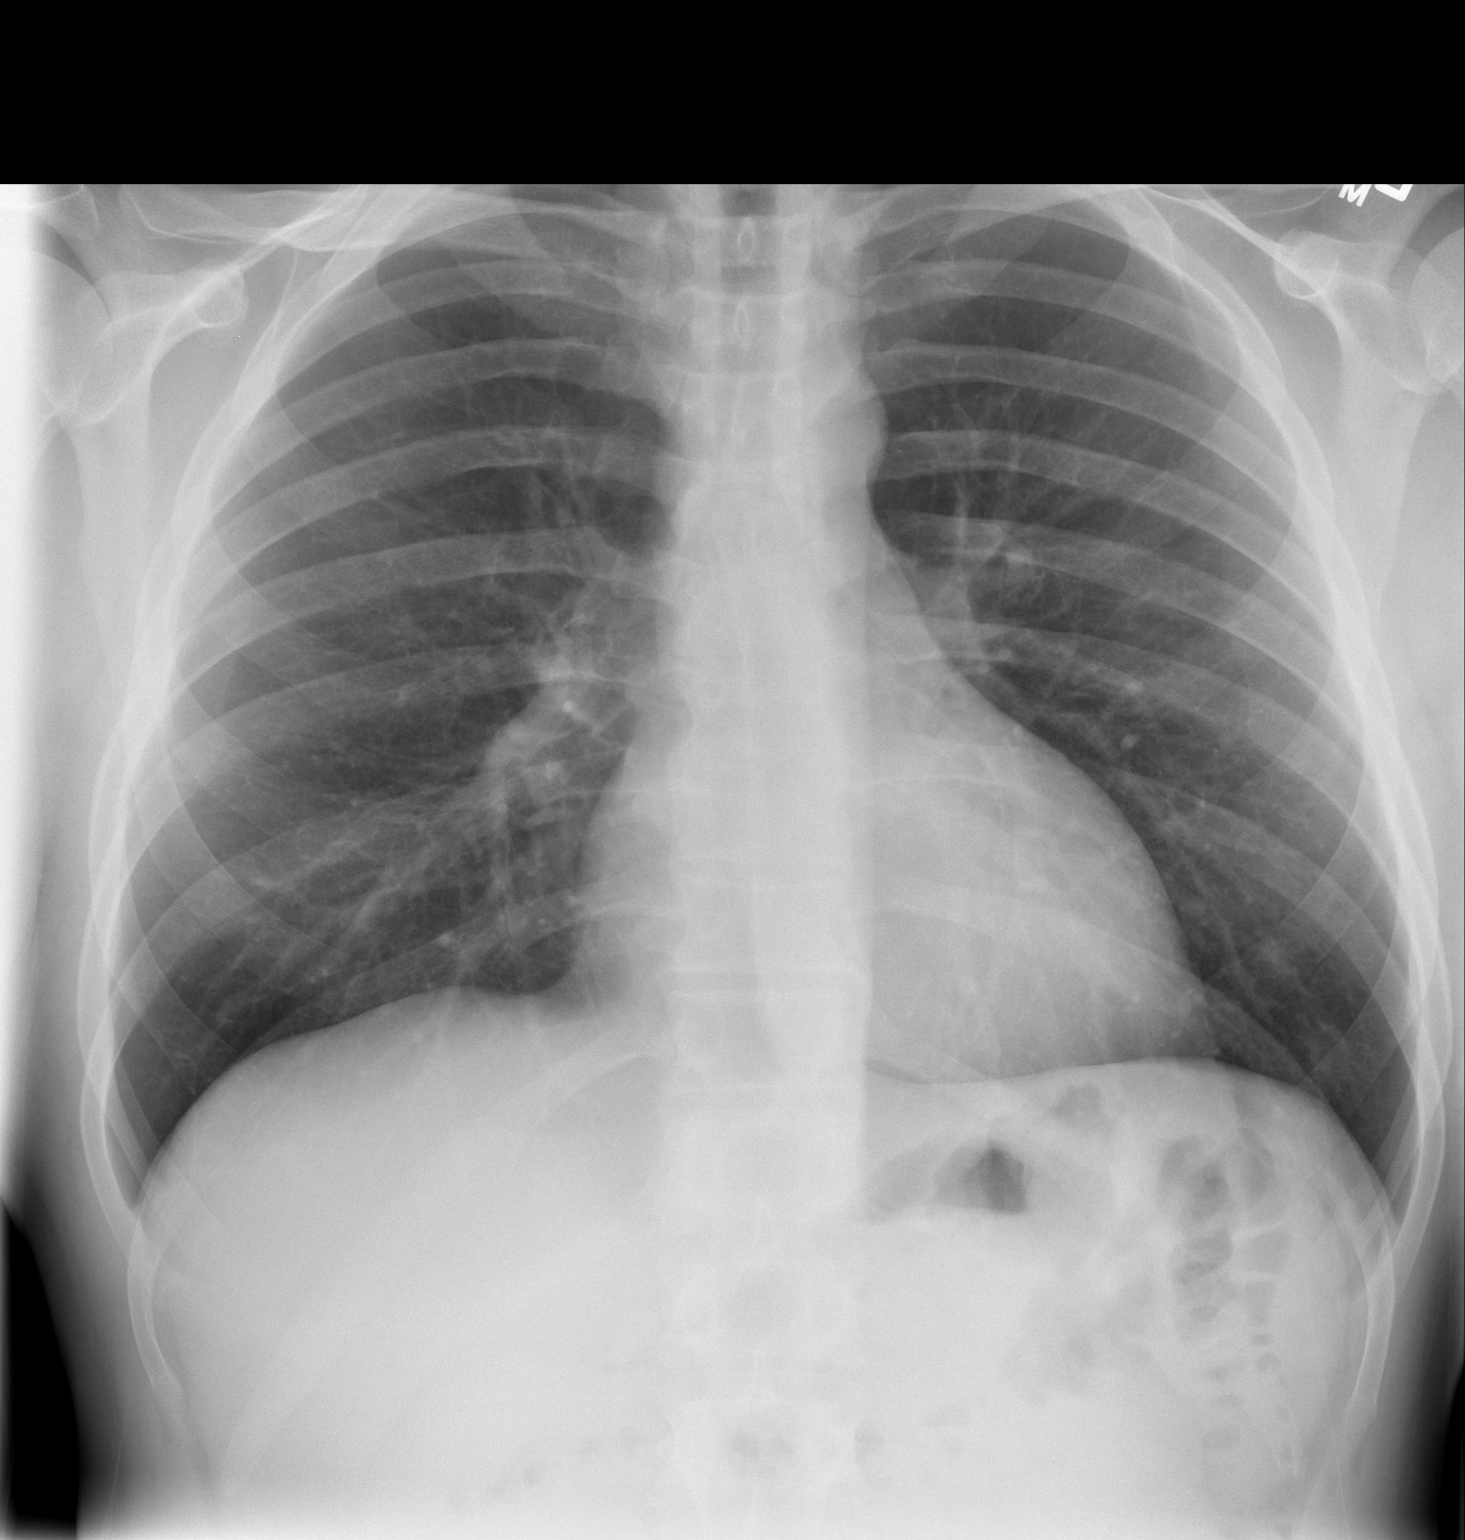

[w chest lat]
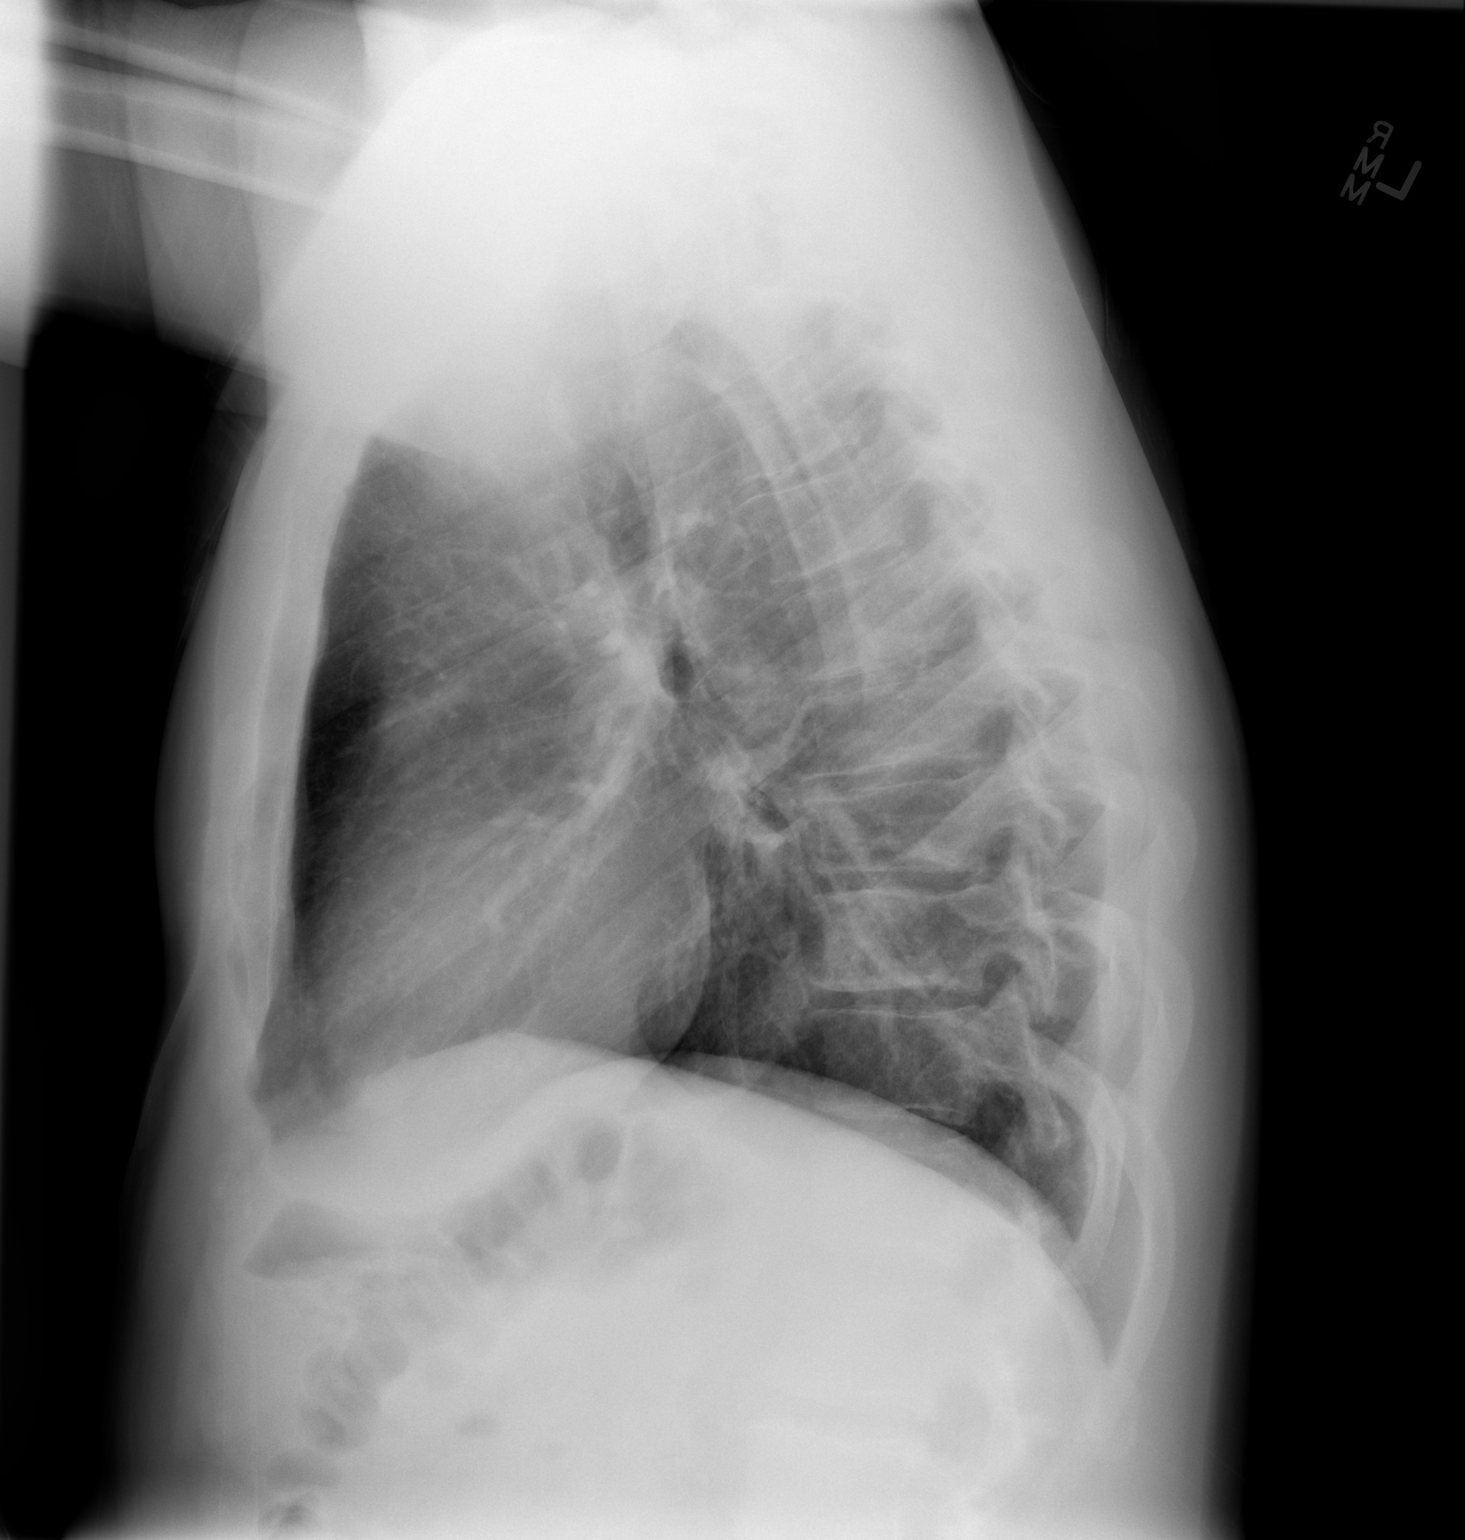

[2 of 2 positions shown; findings below may reference images not displayed]

FINDINGS: Cardiac silhouette is upper limits of normal size.
Mediastinal and hilar contours appear stable.  Lungs are free of
infiltrates.  No pneumothorax or pleural effusion is seen. Bones
appear average for age.
IMPRESSION: No acute or active cardiopulmonary or pleural abnormalities are
evident.

## 2013-11-22 ENCOUNTER — Telehealth: Payer: Self-pay

## 2013-11-22 NOTE — Telephone Encounter (Signed)
Patient called [facility] requesting refill on Oxy 5 mg, pt last seen in 1/15 and must be seen every three mos for controlled rx.

## 2013-11-25 ENCOUNTER — Encounter: Payer: Self-pay | Admitting: Internal Medicine

## 2013-11-25 ENCOUNTER — Ambulatory Visit (INDEPENDENT_AMBULATORY_CARE_PROVIDER_SITE_OTHER)
Admission: RE | Admit: 2013-11-25 | Discharge: 2013-11-25 | Disposition: A | Discharge status: Home / Self Care | Payer: 59 | Source: Ambulatory Visit | Attending: Internal Medicine | Admitting: Internal Medicine

## 2013-11-25 ENCOUNTER — Other Ambulatory Visit (INDEPENDENT_AMBULATORY_CARE_PROVIDER_SITE_OTHER): Payer: 59

## 2013-11-25 ENCOUNTER — Ambulatory Visit (INDEPENDENT_AMBULATORY_CARE_PROVIDER_SITE_OTHER): Payer: BC Managed Care – PPO | Admitting: Internal Medicine

## 2013-11-25 VITALS — BP 128/68 | HR 70 | Temp 97.8°F | Resp 16 | Ht 72.0 in | Wt 195.8 lb

## 2013-11-25 DIAGNOSIS — M25561 Pain in right knee: Secondary | ICD-10-CM

## 2013-11-25 DIAGNOSIS — F17200 Nicotine dependence, unspecified, uncomplicated: Secondary | ICD-10-CM

## 2013-11-25 DIAGNOSIS — Z720 Tobacco use: Secondary | ICD-10-CM

## 2013-11-25 DIAGNOSIS — M19079 Primary osteoarthritis, unspecified ankle and foot: Secondary | ICD-10-CM

## 2013-11-25 DIAGNOSIS — R7989 Other specified abnormal findings of blood chemistry: Secondary | ICD-10-CM

## 2013-11-25 DIAGNOSIS — R946 Abnormal results of thyroid function studies: Secondary | ICD-10-CM

## 2013-11-25 DIAGNOSIS — M5116 Intervertebral disc disorders with radiculopathy, lumbar region: Secondary | ICD-10-CM

## 2013-11-25 DIAGNOSIS — I82409 Acute embolism and thrombosis of unspecified deep veins of unspecified lower extremity: Secondary | ICD-10-CM

## 2013-11-25 DIAGNOSIS — M25569 Pain in unspecified knee: Secondary | ICD-10-CM

## 2013-11-25 DIAGNOSIS — M79609 Pain in unspecified limb: Secondary | ICD-10-CM

## 2013-11-25 DIAGNOSIS — R400 Somnolence: Secondary | ICD-10-CM

## 2013-11-25 DIAGNOSIS — M79669 Pain in unspecified lower leg: Secondary | ICD-10-CM

## 2013-11-25 DIAGNOSIS — Z23 Encounter for immunization: Secondary | ICD-10-CM

## 2013-11-25 DIAGNOSIS — G4710 Hypersomnia, unspecified: Secondary | ICD-10-CM

## 2013-11-25 LAB — CBC WITH DIFFERENTIAL/PLATELET
BASOS PCT: 0.3 % (ref 0.0–3.0)
Basophils Absolute: 0 10*3/uL (ref 0.0–0.1)
EOS PCT: 2.2 % (ref 0.0–5.0)
Eosinophils Absolute: 0.2 10*3/uL (ref 0.0–0.7)
HCT: 45.4 % (ref 39.0–52.0)
Hemoglobin: 15.6 g/dL (ref 13.0–17.0)
Lymphocytes Relative: 15.1 % (ref 12.0–46.0)
Lymphs Abs: 1 10*3/uL (ref 0.7–4.0)
MCHC: 34.4 g/dL (ref 30.0–36.0)
MCV: 98.9 fl (ref 78.0–100.0)
MONO ABS: 0.5 10*3/uL (ref 0.1–1.0)
Monocytes Relative: 7.2 % (ref 3.0–12.0)
Neutro Abs: 5.2 10*3/uL (ref 1.4–7.7)
Neutrophils Relative %: 75.2 % (ref 43.0–77.0)
Platelets: 242 10*3/uL (ref 150.0–400.0)
RBC: 4.59 Mil/uL (ref 4.22–5.81)
RDW: 12.6 % (ref 11.5–15.5)
WBC: 6.9 10*3/uL (ref 4.0–10.5)

## 2013-11-25 LAB — BASIC METABOLIC PANEL
BUN: 13 mg/dL (ref 6–23)
CHLORIDE: 104 meq/L (ref 96–112)
CO2: 29 mEq/L (ref 19–32)
CREATININE: 0.9 mg/dL (ref 0.4–1.5)
Calcium: 9.4 mg/dL (ref 8.4–10.5)
GFR: 104.37 mL/min (ref >60.00)
Glucose, Bld: 68 mg/dL — ABNORMAL LOW (ref 70–99)
Potassium: 4.5 mEq/L (ref 3.5–5.1)
SODIUM: 139 meq/L (ref 135–145)

## 2013-11-25 LAB — T4: T4 TOTAL: 7 ug/dL (ref 5.0–12.5)

## 2013-11-25 LAB — TSH: TSH: 0.35 u[IU]/mL (ref 0.35–4.50)

## 2013-11-25 LAB — T3, FREE: T3 FREE: 3.3 pg/mL (ref 2.3–4.2)

## 2013-11-25 MED ORDER — OXYCODONE HCL 5 MG PO TABS: 5.0000 mg | ORAL_TABLET | ORAL | Status: DC | PRN

## 2013-11-25 MED ORDER — VARENICLINE TARTRATE 1 MG PO TABS: 1.0000 mg | ORAL_TABLET | Freq: Two times a day (BID) | ORAL | Status: DC

## 2013-11-25 MED ORDER — VARENICLINE TARTRATE 0.5 MG X 11 & 1 MG X 42 PO MISC: ORAL | Status: DC

## 2013-11-25 NOTE — Assessment & Plan Note (Signed)
He wants to quit smoking I have asked him to try chantix

## 2013-11-25 NOTE — Progress Notes (Signed)
Pre visit review using our clinic review tool, if applicable. No additional management support is needed unless otherwise documented below in the visit note. 

## 2013-11-25 NOTE — Assessment & Plan Note (Signed)
I will recheck his d-dimer to see if there is concern for a recurrence of clotting

## 2013-11-25 NOTE — Assessment & Plan Note (Signed)
He appears to be euthyroid I will recheck his TSH today

## 2013-11-25 NOTE — Patient Instructions (Signed)
Osteoarthritis Osteoarthritis is a disease that causes soreness and swelling (inflammation) of a joint. It occurs when the cartilage at the affected joint wears down. Cartilage acts as a cushion, covering the ends of bones where they meet to form a joint. Osteoarthritis is the most common form of arthritis. It often occurs in older people. The joints affected most often by this condition include those in the:  Ends of the fingers.  Thumbs.  Neck.  Lower back.  Knees.  Hips. CAUSES  Over time, the cartilage that covers the ends of bones begins to wear away. This causes bone to rub on bone, producing pain and stiffness in the affected joints.  RISK FACTORS Certain factors can increase your chances of having osteoarthritis, including:  Older age.  Excessive body weight.  Overuse of joints. SIGNS AND SYMPTOMS   Pain, swelling, and stiffness in the joint.  Over time, the joint may lose its normal shape.  Small deposits of bone (osteophytes) may grow on the edges of the joint.  Bits of bone or cartilage can break off and float inside the joint space. This may cause more pain and damage. DIAGNOSIS  Your health care provider will do a physical exam and ask about your symptoms. Various tests may be ordered, such as:  X-rays of the affected joint.  An MRI scan.  Blood tests to rule out other types of arthritis.  Joint fluid tests. This involves using a needle to draw fluid from the joint and examining the fluid under a microscope. TREATMENT  Goals of treatment are to control pain and improve joint function. Treatment plans may include:  A prescribed exercise program that allows for rest and joint relief.  A weight control plan.  Pain relief techniques, such as:  Properly applied heat and cold.  Electric pulses delivered to nerve endings under the skin (transcutaneous electrical nerve stimulation, TENS).  Massage.  Certain nutritional supplements.  Medicines to  control pain, such as:  Acetaminophen.  Nonsteroidal anti-inflammatory drugs (NSAIDs), such as naproxen.  Narcotic or central-acting agents, such as tramadol.  Corticosteroids. These can be given orally or as an injection.  Surgery to reposition the bones and relieve pain (osteotomy) or to remove loose pieces of bone and cartilage. Joint replacement may be needed in advanced states of osteoarthritis. HOME CARE INSTRUCTIONS   Only take over-the-counter or prescription medicines as directed by your health care provider. Take all medicines exactly as instructed.  Maintain a healthy weight. Follow your health care provider's instructions for weight control. This may include dietary instructions.  Exercise as directed. Your health care provider can recommend specific types of exercise. These may include:  Strengthening exercises These are done to strengthen the muscles that support joints affected by arthritis. They can be performed with weights or with exercise bands to add resistance.  Aerobic activities These are exercises, such as brisk walking or low-impact aerobics, that get your heart pumping.  Range-of-motion activities These keep your joints limber.  Balance and agility exercises These help you maintain daily living skills.  Rest your affected joints as directed by your health care provider.  Follow up with your health care provider as directed. SEEK MEDICAL CARE IF:   Your skin turns red.  You develop a rash in addition to your joint pain.  You have worsening joint pain. SEEK IMMEDIATE MEDICAL CARE IF:  You have a significant loss of weight or appetite.  You have a fever along with joint or muscle aches.  You have   night sweats. FOR MORE INFORMATION  National Institute of Arthritis and Musculoskeletal and Skin Diseases: www.niams.nih.gov National Institute on Aging: www.nia.nih.gov American College of Rheumatology: www.rheumatology.org Document Released: 07/04/2005  Document Revised: 04/24/2013 Document Reviewed: 03/11/2013 [employer] Patient Information 2014 [employer].  

## 2013-11-25 NOTE — Assessment & Plan Note (Signed)
Films show DJD - will cont the current meds for pain

## 2013-11-25 NOTE — Progress Notes (Signed)
Subjective:    Patient ID: David Shelton, male    DOB: Sep 14, 1970, 43 y.o.   MRN: [id number]  Arthritis Presents for follow-up visit. He complains of pain and joint swelling. He reports no stiffness or joint warmth. The symptoms have been worsening. Affected locations include the right knee. His pain is at a severity of 6/10. Associated symptoms include pain at night and pain while resting. Pertinent negatives include no diarrhea, dry eyes, dry mouth, dysuria, fatigue, fever, rash, Raynaud's syndrome, uveitis or weight loss. His past medical history is significant for osteoarthritis. His pertinent risk factors include overuse. Past treatments include acetaminophen, an opioid and NSAIDs. The treatment provided moderate relief. Factors aggravating his arthritis include activity. Compliance with prior treatments has been variable.      Review of Systems  Constitutional: Negative.  Negative for fever, chills, weight loss, diaphoresis, appetite change and fatigue.  HENT: Negative.   Eyes: Negative.   Respiratory: Negative.  Negative for cough, choking, chest tightness, shortness of breath and stridor.   Cardiovascular: Negative.  Negative for chest pain, palpitations and leg swelling.  Gastrointestinal: Negative.  Negative for abdominal pain and diarrhea.  Endocrine: Negative.   Genitourinary: Negative.  Negative for dysuria.  Musculoskeletal: Positive for arthritis and joint swelling. Negative for stiffness.       He has had worsening right knee pain and has had some pain in his right calf, he is worried that there may be another blood clot on his calf.  Skin: Negative for rash.  Allergic/Immunologic: Negative.   Neurological: Negative.   Hematological: Negative.  Negative for adenopathy. Does not bruise/bleed easily.  Psychiatric/Behavioral: Negative.        Objective:   Physical Exam  Vitals reviewed. Constitutional: He is oriented to person, place, and time. He appears well-developed  and well-nourished. No distress.  HENT:  Head: Normocephalic and atraumatic.  Mouth/Throat: Oropharynx is clear and moist. No oropharyngeal exudate.  Eyes: Conjunctivae are normal. Right eye exhibits no discharge. Left eye exhibits no discharge. No scleral icterus.  Neck: Normal range of motion. Neck supple. No JVD present. No tracheal deviation present. No thyromegaly present.  Cardiovascular: Normal rate, regular rhythm, normal heart sounds and intact distal pulses.  Exam reveals no gallop and no friction rub.   No murmur heard. Pulses:      Carotid pulses are 1+ on the right side, and 1+ on the left side.      Radial pulses are 1+ on the right side, and 1+ on the left side.       Femoral pulses are 1+ on the right side, and 1+ on the left side.      Popliteal pulses are 1+ on the right side, and 1+ on the left side.       Dorsalis pedis pulses are 1+ on the right side, and 1+ on the left side.       Posterior tibial pulses are 1+ on the right side, and 1+ on the left side.  Pulmonary/Chest: Effort normal and breath sounds normal. No stridor. No respiratory distress. He has no wheezes. He has no rales. He exhibits no tenderness.  Abdominal: Soft. Bowel sounds are normal. He exhibits no distension and no mass. There is no tenderness. There is no rebound and no guarding.  Musculoskeletal: Normal range of motion. He exhibits no edema.       Right knee: He exhibits deformity (DJD changes). He exhibits normal range of motion, no swelling, no effusion, no ecchymosis, no  laceration, no erythema, normal alignment, no LCL laxity, normal patellar mobility and no bony tenderness. Tenderness found. Medial joint line tenderness noted.       Right lower leg: Normal. He exhibits no tenderness, no bony tenderness, no swelling, no edema, no deformity and no laceration.  Lymphadenopathy:    He has no cervical adenopathy.  Neurological: He is oriented to person, place, and time.  Skin: Skin is warm and dry. No  rash noted. He is not diaphoretic. No erythema. No pallor.     Lab Results  Component Value Date   WBC 5.6 05/07/2013   HGB 14.9 05/07/2013   HCT 42.6 05/07/2013   PLT 216.0 05/07/2013   GLUCOSE 118* 05/07/2013   CHOL 130 09/24/2012   TRIG 151.0* 09/24/2012   HDL 45.70 09/24/2012   LDLCALC 54 09/24/2012   ALT 23 05/07/2013   AST 24 05/07/2013   NA 138 05/07/2013   K 3.8 05/07/2013   CL 103 05/07/2013   CREATININE 0.8 05/07/2013   BUN 13 05/07/2013   CO2 28 05/07/2013   TSH 0.76 05/07/2013   INR 1.08 05/12/2012   HGBA1C 5.1 09/24/2012       Assessment & Plan:

## 2013-11-26 ENCOUNTER — Encounter: Payer: Self-pay | Admitting: Internal Medicine

## 2013-11-26 LAB — D-DIMER, QUANTITATIVE: D-Dimer, Quant: 0.22 ug/mL-FEU (ref 0.00–0.48)

## 2014-01-16 ENCOUNTER — Ambulatory Visit (INDEPENDENT_AMBULATORY_CARE_PROVIDER_SITE_OTHER): Payer: BC Managed Care – PPO | Admitting: Internal Medicine

## 2014-01-16 ENCOUNTER — Encounter: Payer: Self-pay | Admitting: Internal Medicine

## 2014-01-16 VITALS — BP 126/72 | HR 70 | Temp 98.0°F | Resp 16 | Ht 72.0 in | Wt 198.0 lb

## 2014-01-16 DIAGNOSIS — M19079 Primary osteoarthritis, unspecified ankle and foot: Secondary | ICD-10-CM

## 2014-01-16 DIAGNOSIS — M19071 Primary osteoarthritis, right ankle and foot: Secondary | ICD-10-CM

## 2014-01-16 DIAGNOSIS — M25561 Pain in right knee: Secondary | ICD-10-CM

## 2014-01-16 DIAGNOSIS — M5126 Other intervertebral disc displacement, lumbar region: Secondary | ICD-10-CM

## 2014-01-16 DIAGNOSIS — M25569 Pain in unspecified knee: Secondary | ICD-10-CM

## 2014-01-16 DIAGNOSIS — M5116 Intervertebral disc disorders with radiculopathy, lumbar region: Secondary | ICD-10-CM

## 2014-01-16 MED ORDER — OXYCODONE HCL 5 MG PO TABS: 5.0000 mg | ORAL_TABLET | ORAL | Status: DC | PRN

## 2014-01-16 NOTE — Assessment & Plan Note (Signed)
He had a previous ACL repair but he worries that there is something else wrong with his knee so I have asked him to f/up with ortho He will cont the current meds for pain

## 2014-01-16 NOTE — Patient Instructions (Signed)
Osteoarthritis Osteoarthritis is a disease that causes soreness and swelling (inflammation) of a joint. It occurs when the cartilage at the affected joint wears down. Cartilage acts as a cushion, covering the ends of bones where they meet to form a joint. Osteoarthritis is the most common form of arthritis. It often occurs in older people. The joints affected most often by this condition include those in the:  Ends of the fingers.  Thumbs.  Neck.  Lower back.  Knees.  Hips. CAUSES  Over time, the cartilage that covers the ends of bones begins to wear away. This causes bone to rub on bone, producing pain and stiffness in the affected joints.  RISK FACTORS Certain factors can increase your chances of having osteoarthritis, including:  Older age.  Excessive body weight.  Overuse of joints. SIGNS AND SYMPTOMS   Pain, swelling, and stiffness in the joint.  Over time, the joint may lose its normal shape.  Small deposits of bone (osteophytes) may grow on the edges of the joint.  Bits of bone or cartilage can break off and float inside the joint space. This may cause more pain and damage. DIAGNOSIS  Your health care provider will do a physical exam and ask about your symptoms. Various tests may be ordered, such as:  X-rays of the affected joint.  An MRI scan.  Blood tests to rule out other types of arthritis.  Joint fluid tests. This involves using a needle to draw fluid from the joint and examining the fluid under a microscope. TREATMENT  Goals of treatment are to control pain and improve joint function. Treatment plans may include:  A prescribed exercise program that allows for rest and joint relief.  A weight control plan.  Pain relief techniques, such as:  Properly applied heat and cold.  Electric pulses delivered to nerve endings under the skin (transcutaneous electrical nerve stimulation, TENS).  Massage.  Certain nutritional supplements.  Medicines to  control pain, such as:  Acetaminophen.  Nonsteroidal anti-inflammatory drugs (NSAIDs), such as naproxen.  Narcotic or central-acting agents, such as tramadol.  Corticosteroids. These can be given orally or as an injection.  Surgery to reposition the bones and relieve pain (osteotomy) or to remove loose pieces of bone and cartilage. Joint replacement may be needed in advanced states of osteoarthritis. HOME CARE INSTRUCTIONS   Only take over-the-counter or prescription medicines as directed by your health care provider. Take all medicines exactly as instructed.  Maintain a healthy weight. Follow your health care provider's instructions for weight control. This may include dietary instructions.  Exercise as directed. Your health care provider can recommend specific types of exercise. These may include:  Strengthening exercises--These are done to strengthen the muscles that support joints affected by arthritis. They can be performed with weights or with exercise bands to add resistance.  Aerobic activities--These are exercises, such as brisk walking or low-impact aerobics, that get your heart pumping.  Range-of-motion activities--These keep your joints limber.  Balance and agility exercises--These help you maintain daily living skills.  Rest your affected joints as directed by your health care provider.  Follow up with your health care provider as directed. SEEK MEDICAL CARE IF:   Your skin turns red.  You develop a rash in addition to your joint pain.  You have worsening joint pain. SEEK IMMEDIATE MEDICAL CARE IF:  You have a significant loss of weight or appetite.  You have a fever along with joint or muscle aches.  You have night sweats. FOR MORE   INFORMATION  National Institute of Arthritis and Musculoskeletal and Skin Diseases: www.niams.nih.gov National Institute on Aging: www.nia.nih.gov American College of Rheumatology: www.rheumatology.org Document Released:  07/04/2005 Document Revised: 04/24/2013 Document Reviewed: 03/11/2013 Exit[employer] Patient Information 2015 [employer]. This information is not intended to replace advice given to you by your health care provider. Make sure you discuss any questions you have with your health care provider.  

## 2014-01-16 NOTE — Progress Notes (Signed)
Subjective:    Patient ID: David Shelton, male    DOB: Sep 16, 1970, 43 y.o.   MRN: [id number]  Arthritis Presents for follow-up visit. The disease course has been fluctuating. He complains of pain. He reports no stiffness, joint swelling or joint warmth. Affected locations include the right ankle and right knee. His pain is at a severity of 3/10. Associated symptoms include pain at night and pain while resting. Pertinent negatives include no diarrhea, dry eyes, dry mouth, dysuria, fatigue, fever, rash, Raynaud's syndrome, uveitis or weight loss. His past medical history is significant for osteoarthritis. His pertinent risk factors include overuse. Past treatments include NSAIDs and an opioid. The treatment provided moderate relief. Factors aggravating his arthritis include activity. Compliance with prior treatments has been good.      Review of Systems  Constitutional: Negative.  Negative for fever, weight loss and fatigue.  HENT: Negative.   Eyes: Negative.   Respiratory: Negative.  Negative for cough, choking, chest tightness, shortness of breath and stridor.   Cardiovascular: Negative.  Negative for chest pain, palpitations and leg swelling.  Gastrointestinal: Negative.  Negative for abdominal pain and diarrhea.  Endocrine: Negative.   Genitourinary: Negative.  Negative for dysuria.  Musculoskeletal: Positive for arthralgias and arthritis. Negative for back pain, gait problem, joint swelling, myalgias, neck pain, neck stiffness and stiffness.  Skin: Negative.  Negative for rash.  Allergic/Immunologic: Negative.   Neurological: Negative.   Hematological: Negative.  Negative for adenopathy. Does not bruise/bleed easily.  Psychiatric/Behavioral: Negative.        Objective:   Physical Exam  Vitals reviewed. Constitutional: He is oriented to person, place, and time. He appears well-developed and well-nourished. No distress.  HENT:  Head: Normocephalic and atraumatic.  Mouth/Throat:  Oropharynx is clear and moist. No oropharyngeal exudate.  Eyes: Conjunctivae are normal. Right eye exhibits no discharge. Left eye exhibits no discharge. No scleral icterus.  Neck: Normal range of motion. Neck supple. No JVD present. No tracheal deviation present. No thyromegaly present.  Cardiovascular: Normal rate, regular rhythm, normal heart sounds and intact distal pulses.  Exam reveals no gallop and no friction rub.   No murmur heard. Pulmonary/Chest: Effort normal and breath sounds normal. No stridor. No respiratory distress. He has no wheezes. He has no rales. He exhibits no tenderness.  Abdominal: Soft. Bowel sounds are normal. He exhibits no distension and no mass. There is no tenderness. There is no rebound and no guarding.  Musculoskeletal: Normal range of motion. He exhibits no edema and no tenderness.  Lymphadenopathy:    He has no cervical adenopathy.  Neurological: He is oriented to person, place, and time.  Skin: Skin is warm and dry. No rash noted. He is not diaphoretic. No erythema. No pallor.  Psychiatric: He has a normal mood and affect. His behavior is normal. Judgment and thought content normal.     Lab Results  Component Value Date   WBC 6.9 11/25/2013   HGB 15.6 11/25/2013   HCT 45.4 11/25/2013   PLT 242.0 11/25/2013   GLUCOSE 68* 11/25/2013   CHOL 130 09/24/2012   TRIG 151.0* 09/24/2012   HDL 45.70 09/24/2012   LDLCALC 54 09/24/2012   ALT 23 05/07/2013   AST 24 05/07/2013   NA 139 11/25/2013   K 4.5 11/25/2013   CL 104 11/25/2013   CREATININE 0.9 11/25/2013   BUN 13 11/25/2013   CO2 29 11/25/2013   TSH 0.35 11/25/2013   INR 1.08 05/12/2012   HGBA1C 5.1 09/24/2012  Assessment & Plan:

## 2014-01-16 NOTE — Progress Notes (Signed)
Pre visit review using our clinic review tool, if applicable. No additional management support is needed unless otherwise documented below in the visit note. 

## 2014-01-16 NOTE — Assessment & Plan Note (Signed)
He will cont the current meds for pain I have asked him to f/up with ortho to see if there are any procedures that would help him

## 2014-04-29 ENCOUNTER — Telehealth: Payer: Self-pay | Admitting: Internal Medicine

## 2014-04-29 DIAGNOSIS — M19071 Primary osteoarthritis, right ankle and foot: Secondary | ICD-10-CM

## 2014-04-29 MED ORDER — IBUPROFEN 800 MG PO TABS: 800.0000 mg | ORAL_TABLET | Freq: Three times a day (TID) | ORAL | Status: DC

## 2014-04-29 NOTE — Telephone Encounter (Signed)
Patient asked if Dr could send a prescription into his pharmacy for Ibuprofen 800 mg

## 2014-05-08 ENCOUNTER — Encounter: Payer: Self-pay | Admitting: Internal Medicine

## 2014-05-08 ENCOUNTER — Ambulatory Visit (INDEPENDENT_AMBULATORY_CARE_PROVIDER_SITE_OTHER): Payer: BC Managed Care – PPO | Admitting: Internal Medicine

## 2014-05-08 VITALS — BP 130/80 | HR 77 | Temp 97.8°F | Resp 16 | Wt 189.0 lb

## 2014-05-08 DIAGNOSIS — M19071 Primary osteoarthritis, right ankle and foot: Secondary | ICD-10-CM

## 2014-05-08 DIAGNOSIS — F5105 Insomnia due to other mental disorder: Secondary | ICD-10-CM

## 2014-05-08 DIAGNOSIS — Z23 Encounter for immunization: Secondary | ICD-10-CM

## 2014-05-08 DIAGNOSIS — R200 Anesthesia of skin: Secondary | ICD-10-CM | POA: Insufficient documentation

## 2014-05-08 DIAGNOSIS — M25561 Pain in right knee: Secondary | ICD-10-CM

## 2014-05-08 DIAGNOSIS — R202 Paresthesia of skin: Secondary | ICD-10-CM

## 2014-05-08 DIAGNOSIS — F409 Phobic anxiety disorder, unspecified: Secondary | ICD-10-CM

## 2014-05-08 DIAGNOSIS — M5116 Intervertebral disc disorders with radiculopathy, lumbar region: Secondary | ICD-10-CM

## 2014-05-08 DIAGNOSIS — R2232 Localized swelling, mass and lump, left upper limb: Secondary | ICD-10-CM

## 2014-05-08 MED ORDER — OXYCODONE HCL 5 MG PO TABS: 5.0000 mg | ORAL_TABLET | ORAL | Status: DC | PRN

## 2014-05-08 MED ORDER — DOXEPIN HCL 6 MG PO TABS: 1.0000 | ORAL_TABLET | Freq: Every evening | ORAL | Status: DC | PRN

## 2014-05-08 NOTE — Progress Notes (Signed)
Pre visit review using our clinic review tool, if applicable. No additional management support is needed unless otherwise documented below in the visit note. 

## 2014-05-08 NOTE — Patient Instructions (Signed)

## 2014-05-10 NOTE — Assessment & Plan Note (Signed)
Cont doxepin as needed

## 2014-05-10 NOTE — Assessment & Plan Note (Addendum)
Will cont percocet and motrin as needed

## 2014-05-10 NOTE — Assessment & Plan Note (Signed)
Will reorder the NCS and EMG to see if there is a neuropathy that causes this symptom

## 2014-05-10 NOTE — Progress Notes (Signed)
Subjective:    Patient ID: David Shelton, male    DOB: Nov 25, 1970, 43 y.o.   MRN: [id number]  Arthritis Presents for follow-up visit. The disease course has been fluctuating. He complains of pain. He reports no stiffness, joint swelling or joint warmth. Affected locations include the right ankle. His pain is at a severity of 4/10. Associated symptoms include pain at night and pain while resting. Pertinent negatives include no diarrhea, dry eyes, dry mouth, dysuria, fatigue, fever, rash, Raynaud's syndrome, uveitis or weight loss. (He complains of a persistent tingling sensation in his right posterior calf, a NCS was previously ordered but he was not able to afford the test.) His past medical history is significant for osteoarthritis. Past treatments include NSAIDs, an opioid and acetaminophen. The treatment provided significant relief. Factors aggravating his arthritis include activity.      Review of Systems  Constitutional: Negative.  Negative for fever, chills, weight loss, diaphoresis, appetite change and fatigue.  HENT: Negative.   Eyes: Negative.   Respiratory: Negative.  Negative for cough, choking, chest tightness, shortness of breath and stridor.   Cardiovascular: Negative.  Negative for chest pain, palpitations and leg swelling.  Gastrointestinal: Negative.  Negative for nausea, vomiting, abdominal pain, diarrhea and constipation.  Endocrine: Negative.   Genitourinary: Negative.  Negative for dysuria.  Musculoskeletal: Positive for arthralgias and arthritis. Negative for back pain, gait problem, joint swelling, myalgias, neck pain, neck stiffness and stiffness.       He complains of an enlarging painful mass on his left hand for 2 years  Skin: Negative.  Negative for rash.  Allergic/Immunologic: Negative.   Neurological: Negative.  Negative for dizziness, tremors, seizures, syncope, facial asymmetry, speech difficulty, weakness, light-headedness, numbness and headaches.    Hematological: Negative.  Negative for adenopathy. Does not bruise/bleed easily.  Psychiatric/Behavioral: Positive for sleep disturbance. Negative for suicidal ideas, hallucinations, behavioral problems, confusion, self-injury, dysphoric mood, decreased concentration and agitation. The patient is nervous/anxious. The patient is not hyperactive.        Objective:   Physical Exam  Vitals reviewed. Constitutional: He appears well-developed and well-nourished. No distress.  HENT:  Head: Normocephalic and atraumatic.  Mouth/Throat: Oropharynx is clear and moist. No oropharyngeal exudate.  Eyes: Conjunctivae are normal. Right eye exhibits no discharge. Left eye exhibits no discharge. No scleral icterus.  Neck: Normal range of motion. Neck supple. No JVD present. No tracheal deviation present. No thyromegaly present.  Cardiovascular: Normal rate, regular rhythm, normal heart sounds and intact distal pulses.  Exam reveals no gallop and no friction rub.   No murmur heard. Pulmonary/Chest: Effort normal and breath sounds normal. No stridor. No respiratory distress. He has no wheezes. He has no rales. He exhibits no tenderness.  Abdominal: Soft. Bowel sounds are normal. He exhibits no distension and no mass. There is no tenderness. There is no rebound and no guarding.  Musculoskeletal: Normal range of motion. He exhibits no edema and no tenderness.       Hands: Lymphadenopathy:    He has no cervical adenopathy.  Neurological: He is alert. He has normal strength. He displays no atrophy, no tremor and normal reflexes. No cranial nerve deficit or sensory deficit. He exhibits normal muscle tone. He displays a negative Romberg sign. He displays no seizure activity. Coordination and gait normal.  Reflex Scores:      Tricep reflexes are 1+ on the right side and 1+ on the left side.      Bicep reflexes are 1+ on the right  side and 1+ on the left side.      Brachioradialis reflexes are 1+ on the right side  and 1+ on the left side.      Patellar reflexes are 1+ on the right side and 1+ on the left side.      Achilles reflexes are 1+ on the right side and 1+ on the left side. Skin: Skin is warm and dry. No rash noted. He is not diaphoretic. No erythema. No pallor.  Psychiatric: He has a normal mood and affect. His behavior is normal. Judgment and thought content normal.     Lab Results  Component Value Date   WBC 6.9 11/25/2013   HGB 15.6 11/25/2013   HCT 45.4 11/25/2013   PLT 242.0 11/25/2013   GLUCOSE 68* 11/25/2013   CHOL 130 09/24/2012   TRIG 151.0* 09/24/2012   HDL 45.70 09/24/2012   LDLCALC 54 09/24/2012   ALT 23 05/07/2013   AST 24 05/07/2013   NA 139 11/25/2013   K 4.5 11/25/2013   CL 104 11/25/2013   CREATININE 0.9 11/25/2013   BUN 13 11/25/2013   CO2 29 11/25/2013   TSH 0.35 11/25/2013   INR 1.08 05/12/2012   HGBA1C 5.1 09/24/2012       Assessment & Plan:

## 2014-05-10 NOTE — Assessment & Plan Note (Signed)
Refer to hand surgery to consider excision

## 2014-05-14 NOTE — Addendum Note (Signed)
Addended by: Rock NephewARCHIE, Laila Myhre T on: 05/14/2014 08:32 AM   Modules accepted: Orders

## 2014-05-27 ENCOUNTER — Other Ambulatory Visit: Payer: Self-pay | Admitting: *Deleted

## 2014-05-27 DIAGNOSIS — R200 Anesthesia of skin: Secondary | ICD-10-CM

## 2014-05-27 DIAGNOSIS — R202 Paresthesia of skin: Principal | ICD-10-CM

## 2014-07-03 ENCOUNTER — Encounter: Payer: BC Managed Care – PPO | Admitting: Neurology

## 2014-07-04 ENCOUNTER — Encounter: Payer: Self-pay | Admitting: *Deleted

## 2014-07-04 ENCOUNTER — Telehealth: Payer: Self-pay | Admitting: Neurology

## 2014-07-04 NOTE — Progress Notes (Signed)
No show letter was sent for 07/03/2014

## 2014-07-04 NOTE — Telephone Encounter (Signed)
Pt no showed 07/03/14 [facility] appt. Appt was verbally confirmed with pt during reminder calls.  Dr. Yetta BarreJones notified via referral message in [facility].   Alcario DroughtErica - please send no show letter to patient / Sherri S.

## 2014-09-24 ENCOUNTER — Ambulatory Visit (INDEPENDENT_AMBULATORY_CARE_PROVIDER_SITE_OTHER): Payer: 59 | Admitting: Internal Medicine

## 2014-09-24 ENCOUNTER — Encounter: Payer: Self-pay | Admitting: Internal Medicine

## 2014-09-24 VITALS — BP 144/84 | HR 75 | Temp 98.7°F | Resp 16 | Wt 190.8 lb

## 2014-09-24 DIAGNOSIS — M19071 Primary osteoarthritis, right ankle and foot: Secondary | ICD-10-CM

## 2014-09-24 DIAGNOSIS — R2232 Localized swelling, mass and lump, left upper limb: Secondary | ICD-10-CM

## 2014-09-24 DIAGNOSIS — R202 Paresthesia of skin: Secondary | ICD-10-CM

## 2014-09-24 DIAGNOSIS — R200 Anesthesia of skin: Secondary | ICD-10-CM

## 2014-09-24 DIAGNOSIS — M25561 Pain in right knee: Secondary | ICD-10-CM

## 2014-09-24 DIAGNOSIS — M5116 Intervertebral disc disorders with radiculopathy, lumbar region: Secondary | ICD-10-CM

## 2014-09-24 MED ORDER — OXYCODONE HCL 5 MG PO TABS: 5.0000 mg | ORAL_TABLET | ORAL | Status: DC | PRN

## 2014-09-24 NOTE — Progress Notes (Signed)
Pre visit review using our clinic review tool, if applicable. No additional management support is needed unless otherwise documented below in the visit note. 

## 2014-09-24 NOTE — Patient Instructions (Signed)

## 2014-09-25 ENCOUNTER — Encounter: Payer: Self-pay | Admitting: Internal Medicine

## 2014-09-25 NOTE — Progress Notes (Signed)
   Subjective:    Patient ID: David Shelton, male    DOB: [birth year], 44 y.o.   MRN: [id number]  Arthritis Presents for follow-up visit. The disease course has been fluctuating. He complains of pain. He reports no stiffness, joint swelling or joint warmth. Affected locations include the right knee. His pain is at a severity of 6/10. Associated symptoms include pain at night and pain while resting. Pertinent negatives include no diarrhea, dry eyes, dry mouth, dysuria, fatigue, fever, rash, Raynaud's syndrome, uveitis or weight loss. His past medical history is significant for osteoarthritis. His pertinent risk factors include overuse. Past treatments include an opioid. The treatment provided moderate relief. Factors aggravating his arthritis include climbing stairs and activity. Compliance with prior treatments has been good.      Review of Systems  Constitutional: Negative.  Negative for fever, chills, weight loss, diaphoresis, appetite change and fatigue.  HENT: Negative.   Eyes: Negative.   Respiratory: Negative.  Negative for cough, choking, chest tightness, shortness of breath and stridor.   Cardiovascular: Negative.  Negative for chest pain, palpitations and leg swelling.  Gastrointestinal: Negative.  Negative for vomiting, abdominal pain, diarrhea and constipation.  Endocrine: Negative.   Genitourinary: Negative.  Negative for dysuria.  Musculoskeletal: Positive for back pain, arthralgias and arthritis. Negative for joint swelling, stiffness, neck pain and neck stiffness.  Skin: Negative.  Negative for rash.  Allergic/Immunologic: Negative.   Neurological: Negative.   Hematological: Negative.  Negative for adenopathy. Does not bruise/bleed easily.  Psychiatric/Behavioral: Negative.        Objective:   Physical Exam  Constitutional: He is oriented to person, place, and time. He appears well-developed and well-nourished. No distress.  HENT:  Head: Normocephalic and atraumatic.    Mouth/Throat: Oropharynx is clear and moist. No oropharyngeal exudate.  Eyes: Conjunctivae are normal. Right eye exhibits no discharge. Left eye exhibits no discharge. No scleral icterus.  Neck: Normal range of motion. Neck supple. No JVD present. No tracheal deviation present. No thyromegaly present.  Cardiovascular: Normal rate, regular rhythm, normal heart sounds and intact distal pulses.  Exam reveals no gallop and no friction rub.   No murmur heard. Pulmonary/Chest: Effort normal and breath sounds normal. No stridor. No respiratory distress. He has no wheezes. He has no rales. He exhibits no tenderness.  Abdominal: Soft. Bowel sounds are normal. He exhibits no distension and no mass. There is no tenderness. There is no rebound and no guarding.  Musculoskeletal: Normal range of motion. He exhibits no edema or tenderness.  Lymphadenopathy:    He has no cervical adenopathy.  Neurological: He is oriented to person, place, and time.  Skin: Skin is warm and dry. No rash noted. He is not diaphoretic. No erythema. No pallor.  Vitals reviewed.    Lab Results  Component Value Date   WBC 6.9 11/25/2013   HGB 15.6 11/25/2013   HCT 45.4 11/25/2013   PLT 242.0 11/25/2013   GLUCOSE 68* 11/25/2013   CHOL 130 09/24/2012   TRIG 151.0* 09/24/2012   HDL 45.70 09/24/2012   LDLCALC 54 09/24/2012   ALT 23 05/07/2013   AST 24 05/07/2013   NA 139 11/25/2013   K 4.5 11/25/2013   CL 104 11/25/2013   CREATININE 0.9 11/25/2013   BUN 13 11/25/2013   CO2 29 11/25/2013   TSH 0.35 11/25/2013   INR 1.08 05/12/2012   HGBA1C 5.1 09/24/2012       Assessment & Plan:

## 2014-09-25 NOTE — Assessment & Plan Note (Signed)
Refer to hand surgery again

## 2014-09-25 NOTE — Assessment & Plan Note (Signed)
Refer to neurology again

## 2014-09-25 NOTE — Assessment & Plan Note (Signed)
Will cont oxycodone as needed for pain 

## 2014-10-24 ENCOUNTER — Encounter: Payer: Self-pay | Admitting: Internal Medicine

## 2014-10-24 ENCOUNTER — Ambulatory Visit: Payer: BLUE CROSS/BLUE SHIELD | Admitting: Neurology

## 2014-10-28 ENCOUNTER — Ambulatory Visit: Payer: BLUE CROSS/BLUE SHIELD | Admitting: Neurology

## 2014-10-28 ENCOUNTER — Telehealth: Payer: Self-pay | Admitting: Internal Medicine

## 2014-10-28 ENCOUNTER — Other Ambulatory Visit: Payer: Self-pay | Admitting: Internal Medicine

## 2014-10-28 DIAGNOSIS — M25561 Pain in right knee: Secondary | ICD-10-CM

## 2014-10-28 DIAGNOSIS — M5116 Intervertebral disc disorders with radiculopathy, lumbar region: Secondary | ICD-10-CM

## 2014-10-28 DIAGNOSIS — M19071 Primary osteoarthritis, right ankle and foot: Secondary | ICD-10-CM

## 2014-10-28 MED ORDER — OXYCODONE HCL 5 MG PO TABS: 5.0000 mg | ORAL_TABLET | ORAL | Status: DC | PRN

## 2014-10-28 NOTE — Telephone Encounter (Signed)
Pt called in needing refill on his oxyCODONE (OXY IR/ROXICODONE) 5 MG immediate release tablet [[id number]][[id number]]

## 2014-10-28 NOTE — Telephone Encounter (Signed)
Patient called back regarding the prescription

## 2014-10-29 ENCOUNTER — Telehealth: Payer: Self-pay | Admitting: Neurology

## 2014-10-29 ENCOUNTER — Other Ambulatory Visit: Payer: Self-pay

## 2014-10-29 DIAGNOSIS — M25561 Pain in right knee: Secondary | ICD-10-CM

## 2014-10-29 DIAGNOSIS — M19071 Primary osteoarthritis, right ankle and foot: Secondary | ICD-10-CM

## 2014-10-29 DIAGNOSIS — M5116 Intervertebral disc disorders with radiculopathy, lumbar region: Secondary | ICD-10-CM

## 2014-10-29 MED ORDER — OXYCODONE HCL 5 MG PO TABS: 5.0000 mg | ORAL_TABLET | ORAL | Status: DC | PRN

## 2014-10-29 NOTE — Telephone Encounter (Signed)
Patient notified

## 2014-10-29 NOTE — Telephone Encounter (Signed)
done

## 2014-10-29 NOTE — Telephone Encounter (Signed)
Pt no showed NP appt w/ Dr. Allena KatzPatel. Dr. Sanda [facility]ingerhomas [facility] No show letter to policy mailed to pt / Sherri S.

## 2014-11-26 ENCOUNTER — Telehealth: Payer: Self-pay | Admitting: Internal Medicine

## 2014-11-26 DIAGNOSIS — M19071 Primary osteoarthritis, right ankle and foot: Secondary | ICD-10-CM

## 2014-11-26 DIAGNOSIS — M5116 Intervertebral disc disorders with radiculopathy, lumbar region: Secondary | ICD-10-CM

## 2014-11-26 DIAGNOSIS — M25561 Pain in right knee: Secondary | ICD-10-CM

## 2014-11-26 NOTE — Telephone Encounter (Signed)
Patient requesting a refill for oxyCODONE (OXY IR/ROXICODONE) 5 MG immediate release tablet [[id number]][[id number]] and a prescription for Doxepin HCl (SILENOR) 6 MG TABS [[id number]. Patient is aware Dr. Yetta BarreJones is out this week

## 2014-11-30 MED ORDER — DOXEPIN HCL 6 MG PO TABS: 1.0000 | ORAL_TABLET | Freq: Every day | ORAL | Status: DC | PRN

## 2014-11-30 MED ORDER — OXYCODONE HCL 5 MG PO TABS: 5.0000 mg | ORAL_TABLET | ORAL | Status: DC | PRN

## 2014-11-30 NOTE — Telephone Encounter (Signed)
done

## 2014-12-01 NOTE — Telephone Encounter (Signed)
Pt notified rx ready.

## 2014-12-29 ENCOUNTER — Telehealth: Payer: Self-pay | Admitting: Internal Medicine

## 2014-12-29 NOTE — Telephone Encounter (Signed)
Patient last seen 09/24/14, must be seen every 3 moths for refills in controls.

## 2014-12-29 NOTE — Telephone Encounter (Signed)
Patient requesting refill for oxyCODONE (OXY IR/ROXICODONE) 5 MG immediate release tablet [[id number]

## 2014-12-30 ENCOUNTER — Ambulatory Visit (INDEPENDENT_AMBULATORY_CARE_PROVIDER_SITE_OTHER): Payer: 59 | Admitting: Internal Medicine

## 2014-12-30 VITALS — BP 118/80 | HR 67 | Temp 98.1°F | Resp 16 | Ht 72.0 in | Wt 191.8 lb

## 2014-12-30 DIAGNOSIS — M19071 Primary osteoarthritis, right ankle and foot: Secondary | ICD-10-CM | POA: Diagnosis not present

## 2014-12-30 DIAGNOSIS — M25561 Pain in right knee: Secondary | ICD-10-CM | POA: Diagnosis not present

## 2014-12-30 DIAGNOSIS — R2232 Localized swelling, mass and lump, left upper limb: Secondary | ICD-10-CM

## 2014-12-30 DIAGNOSIS — M5116 Intervertebral disc disorders with radiculopathy, lumbar region: Secondary | ICD-10-CM | POA: Diagnosis not present

## 2014-12-30 MED ORDER — OXYCODONE HCL 5 MG PO TABS: 5.0000 mg | ORAL_TABLET | ORAL | Status: DC | PRN

## 2014-12-30 MED ORDER — IBUPROFEN 800 MG PO TABS: 800.0000 mg | ORAL_TABLET | Freq: Three times a day (TID) | ORAL | Status: DC

## 2014-12-30 NOTE — Patient Instructions (Signed)

## 2014-12-31 ENCOUNTER — Encounter: Payer: Self-pay | Admitting: Internal Medicine

## 2014-12-31 NOTE — Progress Notes (Signed)
Subjective:  Patient ID: David Shelton, male    DOB: 11-11-1970  Age: 44 y.o. MRN: [id number]  CC: Osteoarthritis   HPI Yoshi Mancillas presents for follow up on arthritis pain, he has persistent pain in his right knee and right ankle. He is moving to [facility] and he has decided to find an ortho surgeon down there to check his right knee. He has intermittent tingling in his right lower leg, calf area but has decided not to see a neurologist about this due to the cost. The current regimen of ibuprofen and oxycodone controls the pain.  Outpatient Prescriptions Prior to Visit  Medication Sig Dispense Refill  . Doxepin HCl (SILENOR) 6 MG TABS Take 1 tablet (6 mg total) by mouth daily as needed. 90 tablet 3  . ibuprofen (ADVIL,MOTRIN) 800 MG tablet Take 1 tablet (800 mg total) by mouth 3 (three) times daily. 90 tablet 5  . oxyCODONE (OXY IR/ROXICODONE) 5 MG immediate release tablet Take 1 tablet (5 mg total) by mouth every 4 (four) hours as needed. 100 tablet 0   No facility-administered medications prior to visit.    ROS Review of Systems  Constitutional: Negative.  Negative for fever, chills, diaphoresis, appetite change and fatigue.  HENT: Negative.   Eyes: Negative.   Respiratory: Negative.  Negative for cough, choking, chest tightness, shortness of breath and stridor.   Cardiovascular: Negative.  Negative for chest pain, palpitations and leg swelling.  Gastrointestinal: Negative.  Negative for nausea, vomiting, abdominal pain, diarrhea, constipation and blood in stool.  Endocrine: Negative.   Genitourinary: Negative.   Musculoskeletal: Positive for back pain and arthralgias. Negative for myalgias, joint swelling, gait problem, neck pain and neck stiffness.  Skin: Negative.  Negative for rash.  Allergic/Immunologic: Negative.   Neurological: Negative.  Negative for dizziness, tremors, weakness, light-headedness, numbness and headaches.  Hematological: Negative.  Negative for adenopathy. Does  not bruise/bleed easily.  Psychiatric/Behavioral: Positive for sleep disturbance. Negative for suicidal ideas, confusion, dysphoric mood and decreased concentration. The patient is not nervous/anxious.     Objective:  BP 118/80 mmHg  Pulse 67  Temp(Src) 98.1 F (36.7 C) (Oral)  Ht 6' (1.829 m)  Wt 191 lb 12 oz (86.977 kg)  BMI 26.00 kg/m2  SpO2 97%  BP Readings from Last 3 Encounters:  12/30/14 118/80  09/24/14 144/84  05/08/14 130/80    Wt Readings from Last 3 Encounters:  12/30/14 191 lb 12 oz (86.977 kg)  09/24/14 190 lb 12 oz (86.524 kg)  05/08/14 189 lb (85.73 kg)    Physical Exam  Constitutional: He is oriented to person, place, and time. He appears well-developed and well-nourished. No distress.  HENT:  Head: Normocephalic and atraumatic.  Mouth/Throat: Oropharynx is clear and moist. No oropharyngeal exudate.  Eyes: Conjunctivae are normal. Right eye exhibits no discharge. Left eye exhibits no discharge. No scleral icterus.  Neck: Normal range of motion. Neck supple. No JVD present. No tracheal deviation present. No thyromegaly present.  Cardiovascular: Normal rate, regular rhythm, normal heart sounds and intact distal pulses.  Exam reveals no gallop and no friction rub.   No murmur heard. Pulmonary/Chest: Effort normal and breath sounds normal. No stridor. No respiratory distress. He has no wheezes. He has no rales. He exhibits no tenderness.  Abdominal: Soft. Bowel sounds are normal. He exhibits no distension and no mass. There is no tenderness. There is no rebound and no guarding.  Musculoskeletal: Normal range of motion. He exhibits no edema or tenderness.  Right knee: Normal. He exhibits normal range of motion, no swelling, no effusion, no ecchymosis, no deformity, no laceration, no erythema, normal alignment, no LCL laxity, normal patellar mobility and no bony tenderness. No tenderness found.  Lymphadenopathy:    He has no cervical adenopathy.  Neurological:  He is oriented to person, place, and time.  Skin: Skin is warm and dry. No rash noted. He is not diaphoretic. No erythema. No pallor.  Psychiatric: He has a normal mood and affect. His behavior is normal. Judgment and thought content normal.  Vitals reviewed.   Lab Results  Component Value Date   WBC 6.9 11/25/2013   HGB 15.6 11/25/2013   HCT 45.4 11/25/2013   PLT 242.0 11/25/2013   GLUCOSE 68* 11/25/2013   CHOL 130 09/24/2012   TRIG 151.0* 09/24/2012   HDL 45.70 09/24/2012   LDLCALC 54 09/24/2012   ALT 23 05/07/2013   AST 24 05/07/2013   NA 139 11/25/2013   K 4.5 11/25/2013   CL 104 11/25/2013   CREATININE 0.9 11/25/2013   BUN 13 11/25/2013   CO2 29 11/25/2013   TSH 0.35 11/25/2013   INR 1.08 05/12/2012   HGBA1C 5.1 09/24/2012    Dg Knee Complete 4 Views Right  11/25/2013   CLINICAL DATA:  Right knee pain and swelling  EXAM: RIGHT KNEE - COMPLETE 4+ VIEW  COMPARISON:  None.  FINDINGS: Four views of the right knee submitted. Significant narrowing of medial joint compartment. No acute fracture or subluxation. Mild spurring of medial femoral condyle and medial tibial plateau. Post ACL repair surgical changes noted. Small joint effusion.  IMPRESSION: No acute fracture or subluxation. Degenerative changes medial joint compartment. Small joint effusion.   Electronically Signed   By: Natasha Mead M.D.   On: 11/25/2013 09:20    Assessment & Plan:   Jamieson was seen today for osteoarthritis.  Diagnoses and all orders for this visit:  Primary osteoarthritis of right ankle Orders: -     Discontinue: oxyCODONE (OXY IR/ROXICODONE) 5 MG immediate release tablet; Take 1 tablet (5 mg total) by mouth every 4 (four) hours as needed. -     oxyCODONE (OXY IR/ROXICODONE) 5 MG immediate release tablet; Take 1 tablet (5 mg total) by mouth every 4 (four) hours as needed. -     ibuprofen (ADVIL,MOTRIN) 800 MG tablet; Take 1 tablet (800 mg total) by mouth 3 (three) times daily.  Mass of left hand  - he has not had this evaluated yet, will refer to ortho again Orders: -     Ambulatory referral to Orthopedic Surgery  Right knee pain - he will see an ortho In [facility] for an evaluation of this, he is not willing to go back to see Dr. Ophelia Charter again Orders: -     Discontinue: oxyCODONE (OXY IR/ROXICODONE) 5 MG immediate release tablet; Take 1 tablet (5 mg total) by mouth every 4 (four) hours as needed. -     oxyCODONE (OXY IR/ROXICODONE) 5 MG immediate release tablet; Take 1 tablet (5 mg total) by mouth every 4 (four) hours as needed. -     ibuprofen (ADVIL,MOTRIN) 800 MG tablet; Take 1 tablet (800 mg total) by mouth 3 (three) times daily.  Lumbar disc herniation with radiculopathy Orders: -     Discontinue: oxyCODONE (OXY IR/ROXICODONE) 5 MG immediate release tablet; Take 1 tablet (5 mg total) by mouth every 4 (four) hours as needed. -     oxyCODONE (OXY IR/ROXICODONE) 5 MG immediate release tablet; Take 1  tablet (5 mg total) by mouth every 4 (four) hours as needed. -     ibuprofen (ADVIL,MOTRIN) 800 MG tablet; Take 1 tablet (800 mg total) by mouth 3 (three) times daily.   I am having Mr. Ferrufino maintain his Doxepin HCl, oxyCODONE, and ibuprofen.  Meds ordered this encounter  Medications  . DISCONTD: oxyCODONE (OXY IR/ROXICODONE) 5 MG immediate release tablet    Sig: Take 1 tablet (5 mg total) by mouth every 4 (four) hours as needed.    Dispense:  100 tablet    Refill:  0    Fill on or after 12/30/14  . oxyCODONE (OXY IR/ROXICODONE) 5 MG immediate release tablet    Sig: Take 1 tablet (5 mg total) by mouth every 4 (four) hours as needed.    Dispense:  100 tablet    Refill:  0    Fill on or after 01/29/15  . ibuprofen (ADVIL,MOTRIN) 800 MG tablet    Sig: Take 1 tablet (800 mg total) by mouth 3 (three) times daily.    Dispense:  90 tablet    Refill:  5     Follow-up: Return in about 3 months (around 04/01/2015).  Sanda Linger, MD

## 2015-01-13 ENCOUNTER — Encounter: Payer: Self-pay | Admitting: Internal Medicine

## 2015-02-23 ENCOUNTER — Telehealth: Payer: Self-pay | Admitting: *Deleted

## 2015-02-23 ENCOUNTER — Telehealth: Payer: Self-pay

## 2015-02-23 DIAGNOSIS — M5116 Intervertebral disc disorders with radiculopathy, lumbar region: Secondary | ICD-10-CM

## 2015-02-23 DIAGNOSIS — M19071 Primary osteoarthritis, right ankle and foot: Secondary | ICD-10-CM

## 2015-02-23 DIAGNOSIS — M25561 Pain in right knee: Secondary | ICD-10-CM

## 2015-02-23 MED ORDER — OXYCODONE HCL 5 MG PO TABS: 5.0000 mg | ORAL_TABLET | ORAL | Status: DC | PRN

## 2015-02-23 NOTE — Telephone Encounter (Signed)
Left msg on triage needing a refill on his oxycodone. Pls advise...Raechel Chute

## 2015-02-23 NOTE — Telephone Encounter (Signed)
Informing pt of refill at front

## 2015-02-23 NOTE — Telephone Encounter (Signed)
done

## 2015-02-23 NOTE — Telephone Encounter (Signed)
Notified pt rx ready for pick-up.../lmb 

## 2015-03-24 ENCOUNTER — Telehealth: Payer: Self-pay | Admitting: *Deleted

## 2015-03-24 DIAGNOSIS — M5116 Intervertebral disc disorders with radiculopathy, lumbar region: Secondary | ICD-10-CM

## 2015-03-24 DIAGNOSIS — F5105 Insomnia due to other mental disorder: Principal | ICD-10-CM

## 2015-03-24 DIAGNOSIS — F409 Phobic anxiety disorder, unspecified: Secondary | ICD-10-CM

## 2015-03-24 DIAGNOSIS — M25561 Pain in right knee: Secondary | ICD-10-CM

## 2015-03-24 DIAGNOSIS — M19071 Primary osteoarthritis, right ankle and foot: Secondary | ICD-10-CM

## 2015-03-24 MED ORDER — SUVOREXANT 20 MG PO TABS: 1.0000 | ORAL_TABLET | Freq: Every evening | ORAL | Status: DC | PRN

## 2015-03-24 MED ORDER — OXYCODONE HCL 5 MG PO TABS: 5.0000 mg | ORAL_TABLET | ORAL | Status: DC | PRN

## 2015-03-24 NOTE — Telephone Encounter (Signed)
rxs written.

## 2015-03-24 NOTE — Telephone Encounter (Signed)
Left msg on triage requesting refill on his percocet & would like for md to rx something else for sleep the Silenor does not help at all...Raechel Chute

## 2015-03-24 NOTE — Telephone Encounter (Signed)
Notified pt rx's ready for pick-up../lmb 

## 2015-03-26 ENCOUNTER — Encounter: Payer: 59 | Admitting: Internal Medicine

## 2015-03-28 ENCOUNTER — Encounter (HOSPITAL_COMMUNITY): Payer: Self-pay | Admitting: Emergency Medicine

## 2015-03-28 ENCOUNTER — Emergency Department (HOSPITAL_COMMUNITY)
Admission: EM | Admit: 2015-03-28 | Discharge: 2015-03-28 | Disposition: A | Discharge status: Home / Self Care | Payer: 59 | Attending: Emergency Medicine | Admitting: Emergency Medicine

## 2015-03-28 DIAGNOSIS — R002 Palpitations: Secondary | ICD-10-CM | POA: Diagnosis present

## 2015-03-28 DIAGNOSIS — E079 Disorder of thyroid, unspecified: Secondary | ICD-10-CM | POA: Diagnosis not present

## 2015-03-28 DIAGNOSIS — M1990 Unspecified osteoarthritis, unspecified site: Secondary | ICD-10-CM | POA: Insufficient documentation

## 2015-03-28 DIAGNOSIS — Z86718 Personal history of other venous thrombosis and embolism: Secondary | ICD-10-CM | POA: Diagnosis not present

## 2015-03-28 DIAGNOSIS — R0602 Shortness of breath: Secondary | ICD-10-CM | POA: Insufficient documentation

## 2015-03-28 DIAGNOSIS — Z720 Tobacco use: Secondary | ICD-10-CM | POA: Insufficient documentation

## 2015-03-28 DIAGNOSIS — F1410 Cocaine abuse, uncomplicated: Secondary | ICD-10-CM | POA: Insufficient documentation

## 2015-03-28 DIAGNOSIS — Z862 Personal history of diseases of the blood and blood-forming organs and certain disorders involving the immune mechanism: Secondary | ICD-10-CM | POA: Diagnosis not present

## 2015-03-28 LAB — CBC WITH DIFFERENTIAL/PLATELET
BASOS ABS: 0 10*3/uL (ref 0.0–0.1)
BASOS PCT: 0 % (ref 0–1)
Eosinophils Absolute: 0 10*3/uL (ref 0.0–0.7)
Eosinophils Relative: 1 % (ref 0–5)
HEMATOCRIT: 38.3 % — AB (ref 39.0–52.0)
HEMOGLOBIN: 13.6 g/dL (ref 13.0–17.0)
LYMPHS PCT: 19 % (ref 12–46)
Lymphs Abs: 1 10*3/uL (ref 0.7–4.0)
MCH: 33.8 pg (ref 26.0–34.0)
MCHC: 35.5 g/dL (ref 30.0–36.0)
MCV: 95.3 fL (ref 78.0–100.0)
MONO ABS: 0.4 10*3/uL (ref 0.1–1.0)
Monocytes Relative: 8 % (ref 3–12)
NEUTROS ABS: 3.8 10*3/uL (ref 1.7–7.7)
NEUTROS PCT: 72 % (ref 43–77)
Platelets: 197 10*3/uL (ref 150–400)
RBC: 4.02 MIL/uL — AB (ref 4.22–5.81)
RDW: 12.2 % (ref 11.5–15.5)
WBC: 5.3 10*3/uL (ref 4.0–10.5)

## 2015-03-28 LAB — RAPID URINE DRUG SCREEN, HOSP PERFORMED
Amphetamines: NOT DETECTED
BARBITURATES: NOT DETECTED
BENZODIAZEPINES: NOT DETECTED
Cocaine: POSITIVE — AB
Opiates: NOT DETECTED
Tetrahydrocannabinol: NOT DETECTED

## 2015-03-28 LAB — COMPREHENSIVE METABOLIC PANEL
ALBUMIN: 3.7 g/dL (ref 3.5–5.0)
ALK PHOS: 49 U/L (ref 38–126)
ALT: 18 U/L (ref 17–63)
AST: 22 U/L (ref 15–41)
Anion gap: 9 (ref 5–15)
BILIRUBIN TOTAL: 0.8 mg/dL (ref 0.3–1.2)
BUN: 7 mg/dL (ref 6–20)
CALCIUM: 8.4 mg/dL — AB (ref 8.9–10.3)
CO2: 24 mmol/L (ref 22–32)
CREATININE: 0.9 mg/dL (ref 0.61–1.24)
Chloride: 103 mmol/L (ref 101–111)
GFR calc Af Amer: >60 mL/min (ref >60)
GLUCOSE: 85 mg/dL (ref 65–99)
POTASSIUM: 3.7 mmol/L (ref 3.5–5.1)
Sodium: 136 mmol/L (ref 135–145)
TOTAL PROTEIN: 5.5 g/dL — AB (ref 6.5–8.1)

## 2015-03-28 LAB — I-STAT TROPONIN, ED
TROPONIN I, POC: 0 ng/mL (ref 0.00–0.08)
Troponin i, poc: 0 ng/mL (ref 0.00–0.08)

## 2015-03-28 LAB — ETHANOL: ALCOHOL ETHYL (B): 35 mg/dL — AB (ref <5)

## 2015-03-28 NOTE — ED Notes (Signed)
Pt. Stated, i was with a friend who talked me into doing some Cocaine 1 gram, and a Percocet around 1800 last night.  I took a sleep Aid this morning trying to calm me down.  Im per scribed the Percocet for DVT.

## 2015-03-28 NOTE — Discharge Instructions (Signed)
1. Medications: usual home medications 2. Treatment: rest, drink plenty of fluids 3. Follow Up: please followup with your primary doctor for discussion of your diagnoses and further evaluation after today's visit; please return to the ER for severe chest pain, shortness of breath, loss of consciousness, fever, new or worsening symptoms   Palpitations A palpitation is the feeling that your heartbeat is irregular. It may feel like your heart is fluttering or skipping a beat. It may also feel like your heart is beating faster than normal. This is usually not a serious problem. In some cases, you may need more medical tests. HOME CARE  Avoid:  Caffeine in coffee, tea, soft drinks, diet pills, and energy drinks.  Chocolate.  Alcohol.  Stop smoking if you smoke.  Reduce your stress and anxiety. Try:  A method that measures bodily functions so you can learn to control them (biofeedback).  Yoga.  Meditation.  Physical activity such as swimming, jogging, or walking.  Get plenty of rest and sleep. GET HELP IF:  Your fast or irregular heartbeat continues after 24 hours.  Your palpitations occur more often. GET HELP RIGHT AWAY IF:   You have chest pain.  You feel short of breath.  You have a very bad headache.  You feel dizzy or pass out (faint). MAKE SURE YOU:   Understand these instructions.  Will watch your condition.  Will get help right away if you are not doing well or get worse. Document Released: 04/12/2008 Document Revised: 11/18/2013 Document Reviewed: 09/02/2011 [facility] Patient Information 2015 [place], [place]. This information is not intended to replace advice given to you by your health care provider. Make sure you discuss any questions you have with your health care provider.  Stimulant Use Disorder-Cocaine Cocaine is one of a group of powerful drugs called stimulants. Cocaine has medical uses for stopping nosebleeds and for pain control before minor nose or  dental surgery. However, cocaine is misused because of the effects that it produces. These effects include:   A feeling of extreme pleasure.  Alertness.  High energy. Common street names for cocaine include coke, crack, blow, snow, and nose candy. Cocaine is snorted, dissolved in water and injected, or smoked.  Stimulants are addictive because they activate regions of the brain that produce both the pleasurable sensation of "reward" and psychological dependence. Together, these actions account for loss of control and the rapid development of drug dependence. This means you become ill without the drug (withdrawal) and need to keep using it to function.  Stimulant use disorder is use of stimulants that disrupts your daily life. It disrupts relationships with family and friends and how you do your job. Cocaine increases your blood pressure and heart rate. It can cause a heart attack or stroke. Cocaine can also cause death from irregular heart rate or seizures. SYMPTOMS Symptoms of stimulant use disorder with cocaine include:  Use of cocaine in larger amounts or over a longer period of time than intended.  Unsuccessful attempts to cut down or control cocaine use.  A lot of time spent obtaining, using, or recovering from the effects of cocaine.  A strong desire or urge to use cocaine (craving).  Continued use of cocaine in spite of major problems at work, school, or home because of use.  Continued use of cocaine in spite of relationship problems because of use.  Giving up or cutting down on important life activities because of cocaine use.  Use of cocaine over and over in situations when  it is physically hazardous, such as driving a car.  Continued use of cocaine in spite of a physical problem that is likely related to use. Physical problems can include:  Malnutrition.  Nosebleeds.  Chest pain.  High blood pressure.  A hole that develops between the part of your nose that separates  your nostrils (perforated nasal septum).  Lung and kidney damage.  Continued use of cocaine in spite of a mental problem that is likely related to use. Mental problems can include:  Schizophrenia-like symptoms.  Depression.  Bipolar mood swings.  Anxiety.  Sleep problems.  Need to use more and more cocaine to get the same effect, or lessened effect over time with use of the same amount of cocaine (tolerance).  Having withdrawal symptoms when cocaine use is stopped, or using cocaine to reduce or avoid withdrawal symptoms. Withdrawal symptoms include:  Depressed or irritable mood.  Low energy or restlessness.  Bad dreams.  Poor or excessive sleep.  Increased appetite. DIAGNOSIS Stimulant use disorder is diagnosed by your health care provider. You may be asked questions about your cocaine use and how it affects your life. A physical exam may be done. A drug screen may be ordered. You may be referred to a mental health professional. The diagnosis of stimulant use disorder requires at least two symptoms within 12 months. The type of stimulant use disorder depends on the number of signs and symptoms you have. The type may be:  Mild. Two or three signs and symptoms.  Moderate. Four or five signs and symptoms.  Severe. Six or more signs and symptoms. TREATMENT Treatment for stimulant use disorder is usually provided by mental health professionals with training in substance use disorders. The following options are available:  Counseling or talk therapy. Talk therapy addresses the reasons you use cocaine and ways to keep you from using again. Goals of talk therapy include:  Identifying and avoiding triggers for use.  Handling cravings.  Replacing use with healthy activities.  Support groups. Support groups provide emotional support, advice, and guidance.  Medicine. Certain medicines may decrease cocaine cravings or withdrawal symptoms. HOME CARE INSTRUCTIONS  Take medicines  only as directed by your health care provider.  Identify the people and activities that trigger your cocaine use and avoid them.  Keep all follow-up visits as directed by your health care provider. SEEK MEDICAL CARE IF:  Your symptoms get worse or you relapse.  You are not able to take medicines as directed. SEEK IMMEDIATE MEDICAL CARE IF:  You have serious thoughts about hurting yourself or others.  You have a seizure, chest pain, sudden weakness, or loss of speech or vision. FOR MORE INFORMATION  National Institute on Drug Abuse: http://www.price-smith.com/  Substance Abuse and Mental Health Services Administration: SkateOasis.com.pt Document Released: 07/01/2000 Document Revised: 11/18/2013 Document Reviewed: 07/17/2013 [facility] Patient Information 2015 [place], [place]. This information is not intended to replace advice given to you by your health care provider. Make sure you discuss any questions you have with your health care provider.   Emergency Department Resource Guide 1) Find a Doctor and Pay Out of Pocket Although you won't have to find out who is covered by your insurance plan, it is a good idea to ask around and get recommendations. You will then need to call the office and see if the doctor you have chosen will accept you as a new patient and what types of options they offer for patients who are self-pay. Some doctors offer discounts or will set  up payment plans for their patients who do not have insurance, but you will need to ask so you aren't surprised when you get to your appointment.  2) Contact Your Local Health Department Not all health departments have doctors that can see patients for sick visits, but many do, so it is worth a call to see if yours does. If you don't know where your local health department is, you can check in your phone book. The CDC also has a tool to help you locate your state's health department, and many state websites also have listings of all of their  local health departments.  3) Find a Walk-in Clinic If your illness is not likely to be very severe or complicated, you may want to try a walk in clinic. These are popping up all over the country in pharmacies, drugstores, and shopping centers. They're usually staffed by nurse practitioners or physician assistants that have been trained to treat common illnesses and complaints. They're usually fairly quick and inexpensive. However, if you have serious medical issues or chronic medical problems, these are probably not your best option.  No Primary Care Doctor: - Call Health Connect at  [phone] - they can help you locate a primary care doctor that  accepts your insurance, provides certain services, etc. - Physician Referral Service- [phone]  Chronic Pain Problems: Organization         Address  Phone   Notes  [facility]  [phone] Patients need to be referred by their primary care doctor.   Medication Assistance: Organization         Address  Phone   Notes  [facility] Medication [facility] [address] [place], [place] [address] [phone] --Must be a resident of [facility] -- Must have NO insurance coverage whatsoever (no Medicaid/ Medicare, etc.) -- The pt. MUST have a primary care doctor that directs their care regularly and follows them in the community   [facility]ist  [phone]   [employer]  [phone]    Agencies that provide inexpensive medical care: Organization         Address  Phone   Notes  [facility]  [phone]   [facility]    [phone]   [facility] [address] [place], [place] [address] [phone]   [facility] [address] [place], [place] [phone]   Planned Parenthood    [phone]   [facility]    [phone]   [facility]  [address], [place]  Phone:  [phone], Fax:  [phone] Hours of Operation:  9 am - 6 pm, M-F.  Also accepts Medicaid/Medicare and self-pay.  [facility]  [address], [place] Phone: [phone], Fax: [phone]. Hours of Operation:  8:30 am - 5:30 pm, M-F.  Also accepts Medicaid and self-pay.  [facility] [place] [address] [place] Phone: [phone]   [facility] [address] Natasha Bence [place], [place] [phone], Ext. [phone] Mondays & Thursdays: 7-9 AM.  First 15 patients are seen on a first come, first serve basis.    Medicaid-accepting [facility] Providers:  Organization         Address  Phone   Notes  [facility] [address], [place] [phone] Also  accepts self-pay patients.  [facility] [address] Laurell Josephs [place], [place]  [phone]   [facility] [address], [place] [phone]   [facility] [address], [place] [phone]   Renaye Rakers [address], [place]   [phone] Only accepts [place] patients after they have their name applied to their card.   Self-Pay (no insurance) in [facility]:  Organization         Address  Phone   Notes  Sickle Cell Patients, [facility] [address] [place], [place] [phone]   [facility] [address] [place], [place] [phone]   [facility]  [address], [place] [phone]   [facility]/Dr. Osei-Bonsu  [address] [place] or [address], [place] [phone] Phone number for both [place] and [place] locations is the same.  [facility] [address], [place] [phone]   [facility] [address] [place] or [address]  Dr [phone]   [facility] [address] [place] [phone], phone; [phone], fax Sees patients 1st and 3rd Saturday of every month.  Must not qualify for public or private insurance (i.e. Medicaid, Medicare, [place] Health Choice, Veterans' Benefits)  Household income should be no more than 200% of the poverty level The clinic cannot treat you if you are pregnant or think you are pregnant  Sexually transmitted diseases are not treated at the clinic.    Dental Care: Organization         Address  Phone  Notes  [facility] Department of [facility] [address] [place], [place] [phone] Accepts children up to age 63 who are enrolled in [place] or [place] Health Choice; pregnant women with a Medicaid card; and children who have applied for Medicaid or [place] Health Choice, but were declined, whose parents can pay a reduced fee at time of service.  [facility] Department of [facility]  [address], [place] [phone] Accepts children up to age 72 who are enrolled in [place] or [place] Health Choice; pregnant women with a Medicaid card; and children who have applied for Medicaid or [place] Health Choice, but were declined, whose parents can pay a reduced fee at time of service.  [facility]  [address] [place], [place] [phone] Patients are seen by appointment only. Walk-ins are not accepted. [facility] will see patients 56 years of age and older. Monday - Tuesday (8am-5pm) Most Wednesdays (8:30-5pm) $30 per visit, cash only  [facility] Adult Dental Access PRO[facility]  [address], [facility] [phone] Patients are seen by appointment only. Walk-ins are not accepted. [facility] will see patients 91 years of age and older. One Wednesday Evening (Monthly: Volunteer Based).  $30 per visit, cash only  [employer]  [phone] for  adults; Children under age 64, call Graduate Pediatric Dentistry at [phone]. Children aged 55-14, please call [phone] to request a pediatric application.  Dental services are provided in all areas of dental care including fillings, crowns and bridges, complete and partial dentures, implants, gum treatment, root canals, and extractions. Preventive care is also provided. Treatment is  provided to both adults and children. Patients are selected via a lottery and there is often a waiting list.   [facility] [address], [place]  [phone] [email or web address]   [facility] [address] [place], [place] [phone], Ext. [phone] Second and Fourth Thursday of each month, opens at 6:30 AM; Clinic ends at 9 AM.  Patients are seen on a first-come first-served basis, and a limited number are seen during each clinic.   [facility]  [address] Ether Griffins [place], [place] [phone]   Eligibility Requirements You must have lived in [place], [place], or [place] counties for at least the last three months.   You cannot be eligible for state or federal sponsored [place], including [employer], [place], or [employer].   You generally cannot be eligible for healthcare insurance through your employer.    How to apply: Eligibility screenings are held every Tuesday and Wednesday afternoon from 1:00 pm until 4:00 pm. You do not need an appointment for the interview!  [facility] [address], [place], [place] [phone]   [facility] Health Department  [phone]   [facility] Health Department  [phone]   [facility] Health Department  [phone]    Behavioral Health Resources in the Community: Intensive Outpatient Programs Organization         Address  Phone  Notes  [facility] [address], [place], [place] [phone]   [facility]  [address], [place], [place] [phone]   [facility] [address] [place], [place]  [phone]   [facility] Mental Health [address],  [place], [place] [phone] or [phone]   Substance Abuse Resources Organization         Address  Phone  Notes  [facility]  [phone]   [facility]  [phone]   [facility]  [phone]   Floydene Flock  [phone]   Residential & Outpatient Substance Abuse Program  [phone]   Psychological Services Organization         Address  Phone  Notes  [facility] Behavioral Health  [phone]   [facility]  [phone]   [facility] Mental Health [address], [place] [phone] or [phone]    Mobile Crisis Teams Organization         Address  Phone  Notes  [facility], Mobile Crisis Care Unit  [phone]   [facility]  [address] [place], [place] [phone]   Doristine Locks [address] [place] [phone]    Self-Help/Support Groups Organization         Address  Phone             Notes  [facility] [place] - variety of support groups  [phone]- [phone] Call for more information  Narcotics Anonymous (NA), [facility] [address], [employer] [place]  2 meetings at this location   Statistician         Address  Phone  Notes  [facility] [address],    [place]  [phone]   [facility]  [address], [place] [address], [place], [place] [phone]   [facility] [address] [place], [place] [phone] Admissions: 8am-3pm M-F  [facility] [address],    [place], [place] [phone]  [facility] [address] Starling Manns [place], [place] [phone]   [facility] [address],  [place], [place] [phone]   [facility]  - Intensive Outpatient [address] Laurell Josephs [address], [place], [place] [phone]   [facility] ([facility] [address]  [place], [place] [phone] or [phone]   [facility] ([facility]) [address] [place], [place] [phone] Accepts Medicaid  [facility] [address],  [place] [phone] Substance Abuse/Addiction Treatment   [facility] Organization         Address  Phone  Notes  [facility]  [phone]   Angie Fava, PhD [address] Ervin Knack [place], [place]   [phone] or [phone]   [facility]   [address] [place], [place] [phone]   [facility] [address], [place], [place] [phone] Insurance/Medicaid/sponsorship through [facility] and Families [address]                                    [place], [place] [phone] Therapy/tele-psych/case  [facility] [address], [place] [phone]    Dr. Lolly Mustache  [phone]   [facility]  [employer] [facility] Dept. 1) [address], [place] 2) [address] [place] 3)  [address], [place] [phone]  [phone]   [facility] Child Abuse Hotline [phone] or [phone] (After Hours)

## 2015-03-28 NOTE — ED Notes (Signed)
Patient d/c'd from IV, monitor, continuous pulse oximetry and blood pressure cuff; patient getting dressed to be discharged home; wheelchair at bedside

## 2015-03-28 NOTE — ED Provider Notes (Signed)
CSN: [id number]     Arrival date & time 03/28/15  7829 History   First MD Initiated Contact with Patient 03/28/15 [phone]     Chief Complaint  Patient presents with  . Palpitations  . Shortness of Breath     HPI  Ziquan Fidel is a 44 y.o. male with a PMH of DVT not currently on anticoagulation who presents to the ED with palpitations. Reports he took approximately 1 gram of cocaine last night. States he has used cocaine in the past, and never experienced these symptoms before. His last use was around 3:00 AM. He reports onset of symptoms one hour s/p use. States his heart has been racing since that time and he has felt a throbbing sensation in his hands and feet. Reports he tried a sleeping aid to help him relax, but states this did not help and he became worried about medication interaction with cocaine. Also reports intermittent lightheadedness and dizziness, denies LOC. Denies chest pain. Reports shortness of breath with palpitations. Denies abdominal pain, N/V/D/C.    Past Medical History  Diagnosis Date  . TMJ syndrome   . Arthritis   . Thyroid disease   . DVT (deep venous thrombosis)   . Anemia    Past Surgical History  Procedure Laterality Date  . Bilateral temporomandibular joint arthroplasty    . Arthroscopic repair acl     Family History  Problem Relation Age of Onset  . Arthritis Mother   . Hypertension Father   . Alcohol abuse Neg Hx   . Cancer Neg Hx   . Heart disease Neg Hx   . Hyperlipidemia Neg Hx   . Kidney disease Neg Hx   . Stroke Neg Hx   . Clotting disorder Neg Hx    Social History  Substance Use Topics  . Smoking status: Current Every Day Smoker    Types: Cigarettes  . Smokeless tobacco: Never Used  . Alcohol Use: No     Review of Systems  Constitutional: Negative for fever, chills, activity change, appetite change and fatigue.  Eyes: Negative for visual disturbance.  Respiratory: Positive for shortness of breath.   Cardiovascular: Positive for  palpitations. Negative for chest pain and leg swelling.  Gastrointestinal: Negative for nausea, vomiting, abdominal pain, diarrhea, constipation and abdominal distention.  Genitourinary: Negative for dysuria, urgency and frequency.  Neurological: Positive for dizziness and light-headedness. Negative for syncope, weakness, numbness and headaches.  All other systems reviewed and are negative.     Allergies  Review of patient's allergies indicates no known allergies.  Home Medications   Prior to Admission medications   Medication Sig Start Date End Date Taking? Authorizing Provider  ibuprofen (ADVIL,MOTRIN) 800 MG tablet Take 1 tablet (800 mg total) by mouth 3 (three) times daily. Patient taking differently: Take 800 mg by mouth every 8 (eight) hours as needed (pain).  12/30/14  Yes Etta Grandchild, MD  oxyCODONE (OXY IR/ROXICODONE) 5 MG immediate release tablet Take 1 tablet (5 mg total) by mouth every 4 (four) hours as needed. 03/24/15  Yes Etta Grandchild, MD  Suvorexant (BELSOMRA) 20 MG TABS Take 1 tablet by mouth at bedtime as needed. Patient taking differently: Take 2 tablets by mouth at bedtime as needed (sleep).  03/24/15  Yes Etta Grandchild, MD    BP 152/86 mmHg  Pulse 91  Temp(Src) 97.8 F (36.6 C) (Oral)  Resp 23  SpO2 98% Physical Exam  Constitutional: He is oriented to person, place, and time. He appears  well-developed and well-nourished. No distress.  HENT:  Head: Normocephalic and atraumatic.  Right Ear: External ear normal.  Left Ear: External ear normal.  Nose: Nose normal.  Mouth/Throat: Uvula is midline, oropharynx is clear and moist and mucous membranes are normal.  Eyes: Conjunctivae, EOM and lids are normal. Pupils are equal, round, and reactive to light. Right eye exhibits no discharge. Left eye exhibits no discharge. No scleral icterus.  Neck: Normal range of motion. Neck supple.  Cardiovascular: Normal rate, regular rhythm, normal heart sounds, intact distal  pulses and normal pulses.   Pulmonary/Chest: Effort normal and breath sounds normal. No respiratory distress.  Abdominal: Soft. Normal appearance and bowel sounds are normal. He exhibits no distension and no mass. There is no tenderness. There is no rigidity, no rebound and no guarding.  Musculoskeletal: Normal range of motion. He exhibits no edema or tenderness.  Neurological: He is alert and oriented to person, place, and time. He has normal strength. No cranial nerve deficit or sensory deficit.  Skin: Skin is warm, dry and intact. No rash noted. He is not diaphoretic. No erythema. No pallor.  Psychiatric: He has a normal mood and affect. His speech is normal and behavior is normal. Judgment and thought content normal.  Nursing note and vitals reviewed.   ED Course  Procedures (including critical care time)  Labs Review Labs Reviewed  CBC WITH DIFFERENTIAL/PLATELET - Abnormal; Notable for the following:    RBC 4.02 (*)    HCT 38.3 (*)    All other components within normal limits  COMPREHENSIVE METABOLIC PANEL - Abnormal; Notable for the following:    Calcium 8.4 (*)    Total Protein 5.5 (*)    All other components within normal limits  ETHANOL - Abnormal; Notable for the following:    Alcohol, Ethyl (B) 35 (*)    All other components within normal limits  URINE RAPID DRUG SCREEN, HOSP PERFORMED - Abnormal; Notable for the following:    Cocaine POSITIVE (*)    All other components within normal limits  I-STAT TROPOININ, ED  I-STAT TROPOININ, ED    Imaging Review No results found.   I have personally reviewed and evaluated these images and lab results as part of my medical decision-making.   EKG Interpretation   Date/Time:  Saturday March 28 2015 07:26:27 EDT Ventricular Rate:  98 PR Interval:  189 QRS Duration: 92 QT Interval:  363 QTC Calculation: 463 R Axis:   -51 Text Interpretation:  Sinus rhythm Left atrial enlargement Left anterior  fascicular block RSR' in  V1 or V2, right VCD or RVH No significant change  since last tracing Confirmed by YAO  MD, DAVID ([id number]) on 03/28/2015  8:07:51 AM      MDM   Final diagnoses:  Cocaine abuse  Palpitations   44 year old male presents to the ED with palpitations s/p cocaine use around 3:00AM this morning. Reports throbbing sensation to hands and feet. Reports shortness of breath, lightheadedness, and dizziness. Denies chest pain.   Patient is afebrile. Vital signs stable. No tachycardia. BP stable. O2 sat 95-100% on RA. Regular rate and rhythm, lungs clear to auscultation, abdomen soft, non-tender, non-distended, no lower extremity edema. Normal neuro exam with no focal neuro deficit. Strength and sensation intact. Distal pulses intact.  EKG no acute ischemia. Troponin negative x 1. CBC, CMP unremarkable. Ethanol elevated at 35. UDS positive for cocaine. Will obtain delta troponin.  Delta troponin negative. Patient reports resolution of symptoms, and has not  had recurrence of symptoms throughout ED course. No evidence of ACS, low suspicion for PE. Patient expressed interest in outpatient rehab, resource list given. Patient to follow-up with PCP. Return precautions discussed.   BP 152/86 mmHg  Pulse 91  Temp(Src) 97.8 F (36.6 C) (Oral)  Resp 23  SpO2 98%     Mady Gemma, PA-C 03/28/15 1705  Richardean Canal, MD 03/28/15 2014

## 2015-03-28 NOTE — ED Notes (Signed)
Patient undressed, in gown, on monitor, continuous pulse oximetry and blood pressure cuff 

## 2015-05-11 IMAGING — CR DG KNEE COMPLETE 4+V*R*
4 series · 4 of 4 positions shown · non-contrast
Comparison: None.

CLINICAL DATA: Right knee pain and swelling

EXAM:
RIGHT KNEE - COMPLETE 4+ VIEW

[view not recorded (1 of 4)]
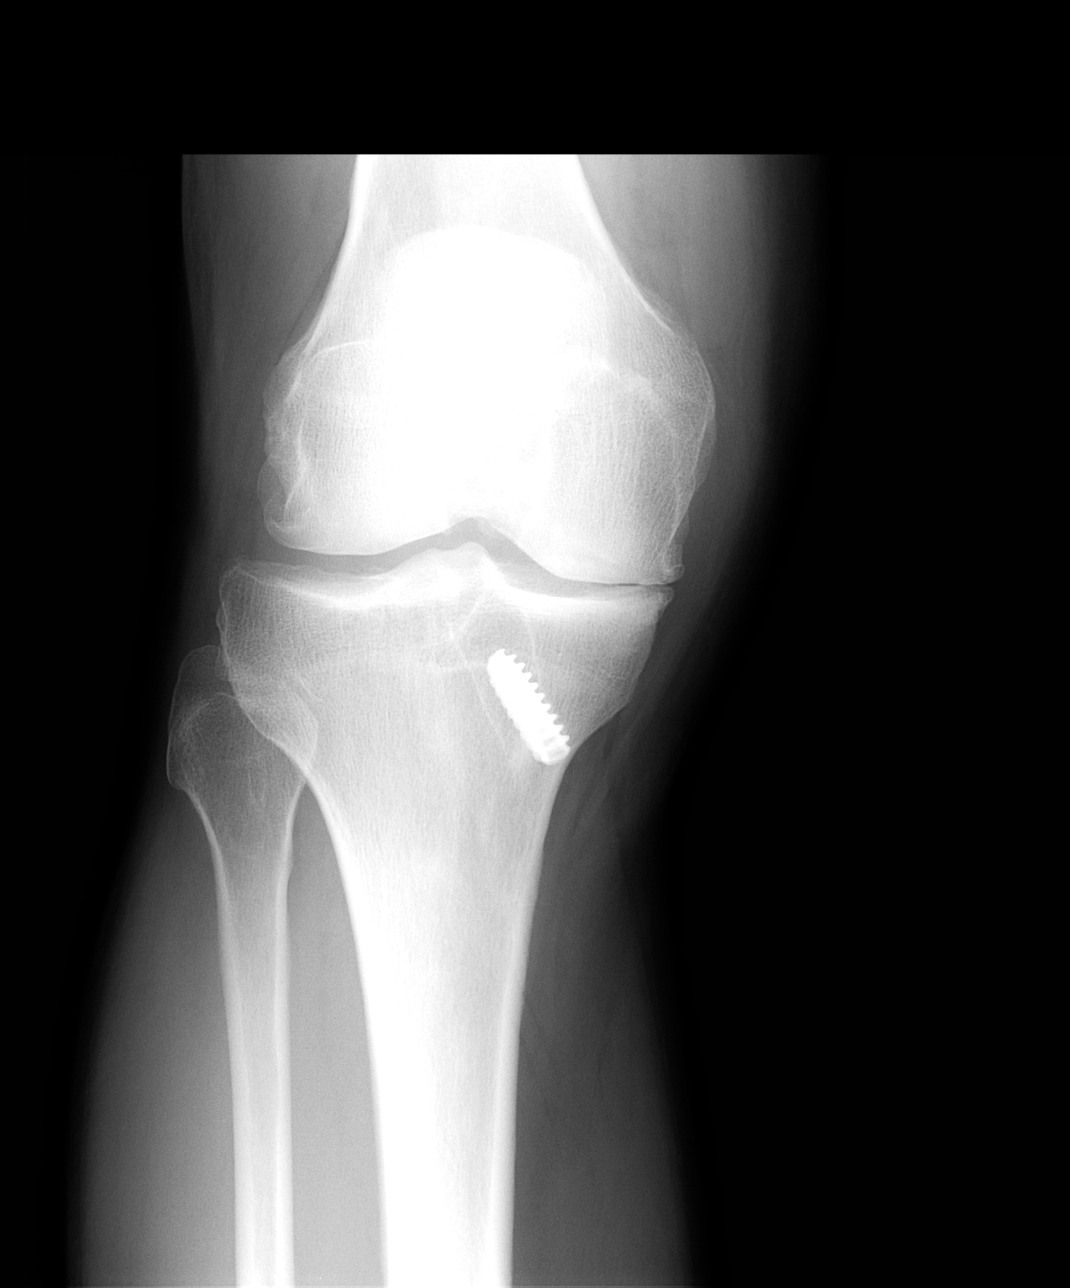

[view not recorded (2 of 4)]
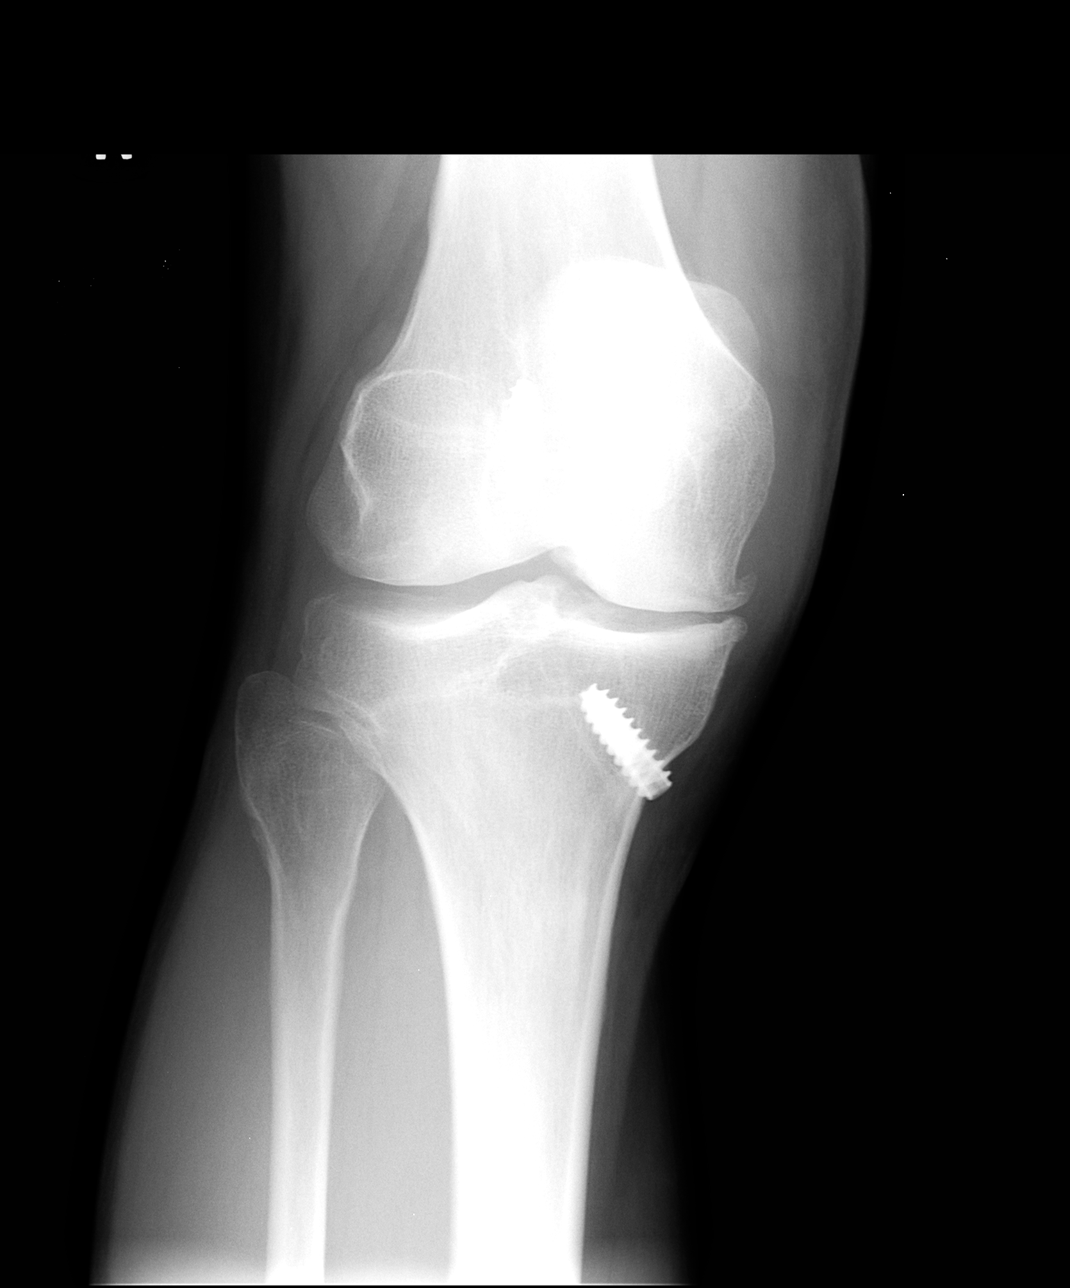

[view not recorded (3 of 4)]
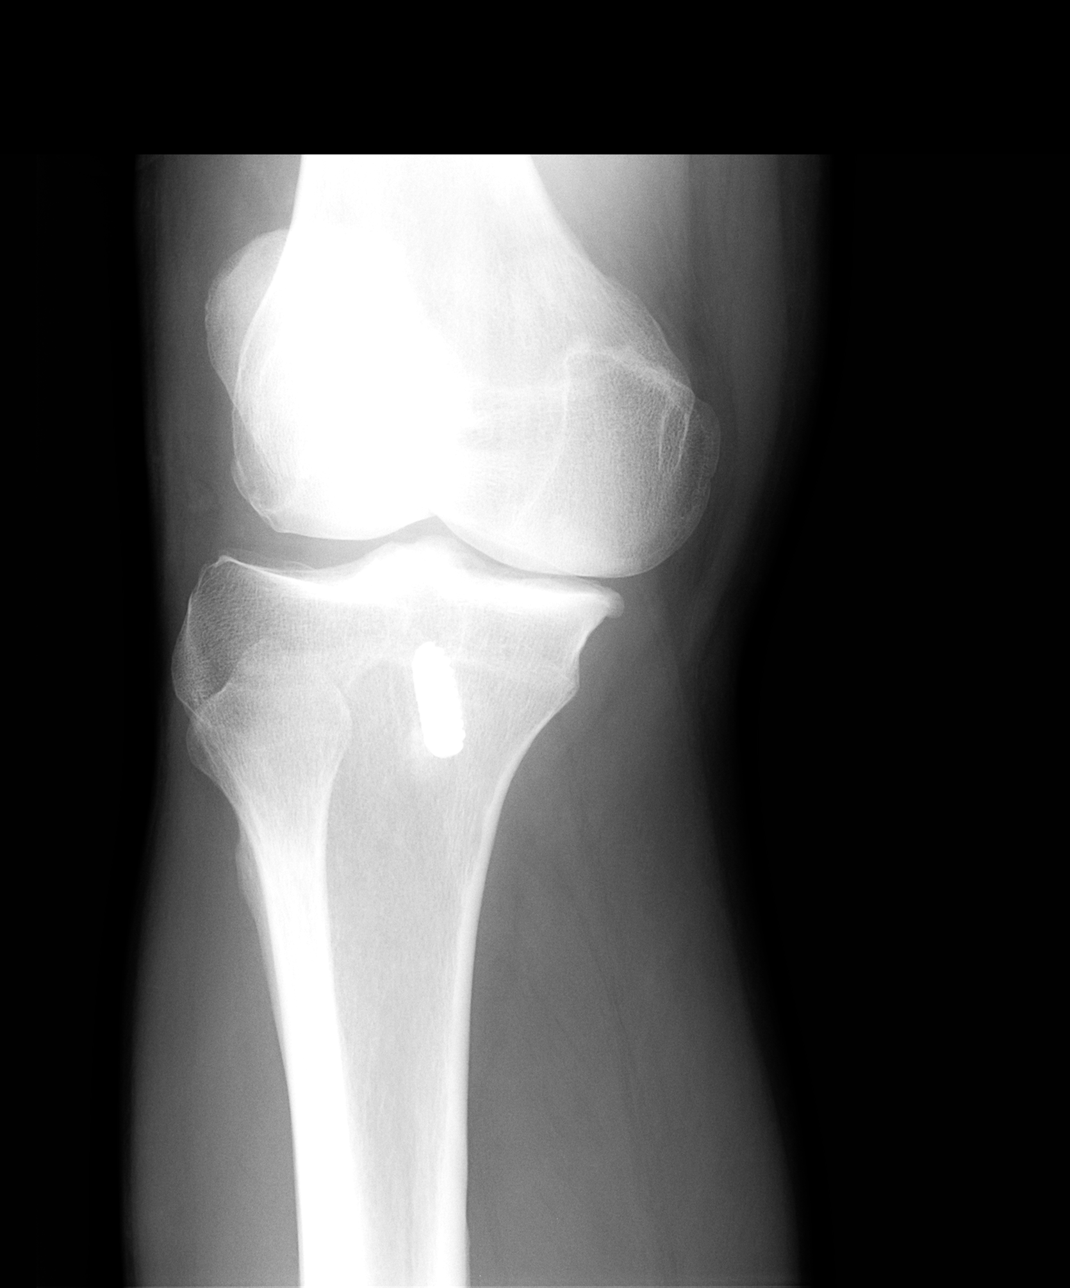

[view not recorded (4 of 4)]
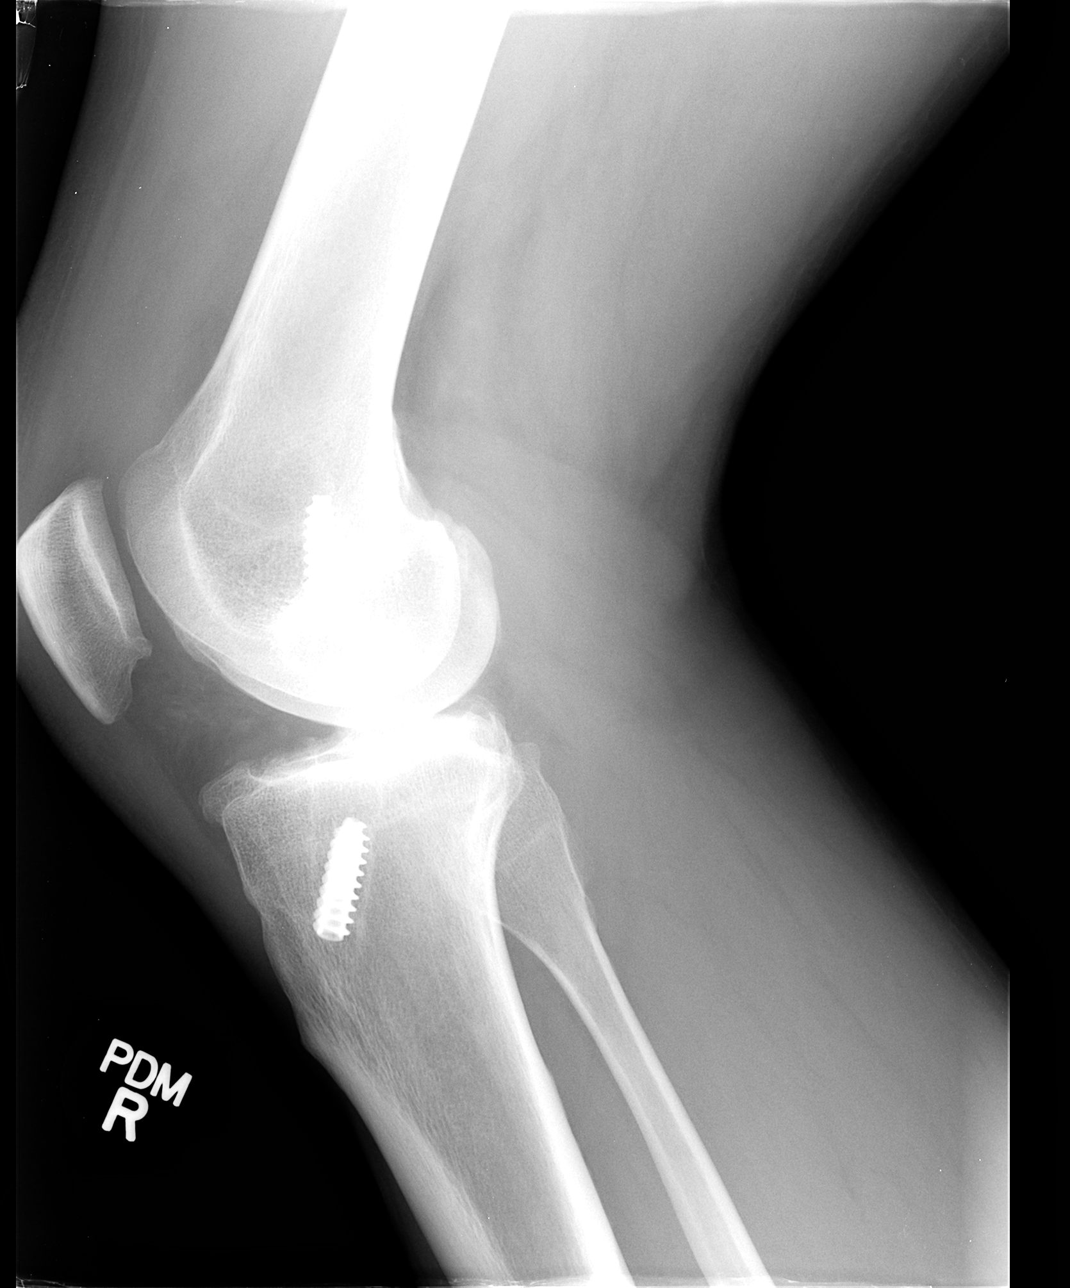

[4 of 4 positions shown; findings below may reference images not displayed]

FINDINGS: Four views of the right knee submitted. Significant narrowing of
medial joint compartment. No acute fracture or subluxation. Mild
spurring of medial femoral condyle and medial tibial plateau. Post
ACL repair surgical changes noted. Small joint effusion.
IMPRESSION: No acute fracture or subluxation. Degenerative changes medial joint
compartment. Small joint effusion.

## 2015-05-18 ENCOUNTER — Telehealth: Payer: Self-pay | Admitting: *Deleted

## 2015-05-18 DIAGNOSIS — F409 Phobic anxiety disorder, unspecified: Secondary | ICD-10-CM

## 2015-05-18 DIAGNOSIS — F5105 Insomnia due to other mental disorder: Principal | ICD-10-CM

## 2015-05-18 NOTE — Telephone Encounter (Signed)
PA form received today.

## 2015-05-18 NOTE — Telephone Encounter (Signed)
Pt is needing PA on his Belsoma...Raechel Chute/lmb

## 2015-05-20 NOTE — Telephone Encounter (Signed)
Approved through 11/17/2015 Pharmacy notified

## 2015-05-20 NOTE — Telephone Encounter (Signed)
KEY [id number] Pa INitiated

## 2015-05-22 NOTE — Telephone Encounter (Signed)
[place] Please send script to   [facility]  Address: [address], [place], [place] [id number] Phone: [phone]

## 2015-05-25 ENCOUNTER — Other Ambulatory Visit: Payer: Self-pay

## 2015-05-25 DIAGNOSIS — F409 Phobic anxiety disorder, unspecified: Secondary | ICD-10-CM

## 2015-05-25 DIAGNOSIS — F5105 Insomnia due to other mental disorder: Principal | ICD-10-CM

## 2015-05-25 MED ORDER — SUVOREXANT 20 MG PO TABS: 1.0000 | ORAL_TABLET | Freq: Every evening | ORAL | Status: DC | PRN

## 2015-05-25 NOTE — Telephone Encounter (Signed)
Done

## 2015-05-25 NOTE — Addendum Note (Signed)
Addended by: Donnita FallsPOTTER, Vernal Hritz E on: 05/25/2015 08:58 AM   Modules accepted: Orders

## 2015-07-15 ENCOUNTER — Ambulatory Visit (INDEPENDENT_AMBULATORY_CARE_PROVIDER_SITE_OTHER): Payer: 59 | Admitting: Internal Medicine

## 2015-07-15 ENCOUNTER — Encounter: Payer: Self-pay | Admitting: Internal Medicine

## 2015-07-15 VITALS — BP 140/86 | HR 76 | Temp 98.2°F | Resp 16 | Ht 72.0 in | Wt 204.0 lb

## 2015-07-15 DIAGNOSIS — M19071 Primary osteoarthritis, right ankle and foot: Secondary | ICD-10-CM | POA: Diagnosis not present

## 2015-07-15 DIAGNOSIS — M25561 Pain in right knee: Secondary | ICD-10-CM

## 2015-07-15 DIAGNOSIS — R2232 Localized swelling, mass and lump, left upper limb: Secondary | ICD-10-CM | POA: Diagnosis not present

## 2015-07-15 DIAGNOSIS — M5116 Intervertebral disc disorders with radiculopathy, lumbar region: Secondary | ICD-10-CM | POA: Diagnosis not present

## 2015-07-15 MED ORDER — OXYCODONE HCL 10 MG PO TABS: 10.0000 mg | ORAL_TABLET | Freq: Three times a day (TID) | ORAL | Status: DC | PRN

## 2015-07-15 NOTE — Progress Notes (Signed)
Pre visit review using our clinic review tool, if applicable. No additional management support is needed unless otherwise documented below in the visit note. 

## 2015-07-15 NOTE — Progress Notes (Signed)
Subjective:  Patient ID: David Shelton, male    DOB: July 10, 1971  Age: 44 y.o. MRN: [id number]  CC: Osteoarthritis   HPI David Shelton presents for worsening right knee pain, chronic right ankle pain, and concerns about a growth on the palmar side of his left hand and wrist. He wants to increase the dose of his oxycodone from 5 mg 3 times a day to 10 mg 3 times a day. [place] and [place] to orthopedic surgeons down there.  Outpatient Prescriptions Prior to Visit  Medication Sig Dispense Refill  . ibuprofen (ADVIL,MOTRIN) 800 MG tablet Take 1 tablet (800 mg total) by mouth 3 (three) times daily. (Patient taking differently: Take 800 mg by mouth every 8 (eight) hours as needed (pain). ) 90 tablet 5  . Suvorexant (BELSOMRA) 20 MG TABS Take 1 tablet by mouth at bedtime as needed. 30 tablet 5  . oxyCODONE (OXY IR/ROXICODONE) 5 MG immediate release tablet Take 1 tablet (5 mg total) by mouth every 4 (four) hours as needed. 100 tablet 0   No facility-administered medications prior to visit.    ROS Review of Systems  Constitutional: Negative for fever, chills, diaphoresis, appetite change and fatigue.  HENT: Negative.   Eyes: Negative.   Respiratory: Negative.  Negative for cough, choking, chest tightness, shortness of breath and stridor.   Cardiovascular: Negative.  Negative for chest pain, palpitations and leg swelling.  Gastrointestinal: Negative.  Negative for nausea, vomiting, abdominal pain, diarrhea, constipation and blood in stool.  Endocrine: Negative.   Genitourinary: Negative.   Musculoskeletal: Positive for back pain and arthralgias. Negative for myalgias, joint swelling and neck pain.  Skin: Negative.  Negative for color change and rash.  Allergic/Immunologic: Negative.   Neurological: Negative.   Hematological: Negative.  Negative for adenopathy. Does not bruise/bleed easily.  Psychiatric/Behavioral: Negative.     Objective:  BP 140/86  mmHg  Pulse 76  Temp(Src) 98.2 F (36.8 C) (Oral)  Resp 16  Ht 6' (1.829 m)  Wt 204 lb (92.534 kg)  BMI 27.66 kg/m2  SpO2 96%  BP Readings from Last 3 Encounters:  07/15/15 140/86  03/28/15 134/62  12/30/14 118/80    Wt Readings from Last 3 Encounters:  07/15/15 204 lb (92.534 kg)  12/30/14 191 lb 12 oz (86.977 kg)  09/24/14 190 lb 12 oz (86.524 kg)    Physical Exam  Constitutional: He is oriented to person, place, and time. He appears well-developed and well-nourished. No distress.  HENT:  Head: Normocephalic and atraumatic.  Mouth/Throat: Oropharynx is clear and moist. No oropharyngeal exudate.  Eyes: Conjunctivae are normal. Right eye exhibits no discharge. Left eye exhibits no discharge. No scleral icterus.  Neck: Normal range of motion. Neck supple. No JVD present. No tracheal deviation present. No thyromegaly present.  Cardiovascular: Normal rate, regular rhythm, normal heart sounds and intact distal pulses.  Exam reveals no gallop and no friction rub.   No murmur heard. Pulmonary/Chest: Effort normal and breath sounds normal. No stridor. No respiratory distress. He has no wheezes. He has no rales. He exhibits no tenderness.  Abdominal: Soft. Bowel sounds are normal. He exhibits no distension and no mass. There is no tenderness. There is no rebound and no guarding.  Musculoskeletal: Normal range of motion. He exhibits no edema or tenderness.       Right knee: Normal. He exhibits normal range of motion, no swelling, no effusion, no ecchymosis, no deformity and no erythema. No tenderness found.  Right ankle: He exhibits deformity (djd). He exhibits normal range of motion. No tenderness.       Hands: Lymphadenopathy:    He has no cervical adenopathy.  Neurological: He is oriented to person, place, and time.  Skin: Skin is warm and dry. No rash noted. He is not diaphoretic. No erythema. No pallor.  Vitals reviewed.   Lab Results  Component Value Date   WBC 5.3  03/28/2015   HGB 13.6 03/28/2015   HCT 38.3* 03/28/2015   PLT 197 03/28/2015   GLUCOSE 85 03/28/2015   CHOL 130 09/24/2012   TRIG 151.0* 09/24/2012   HDL 45.70 09/24/2012   LDLCALC 54 09/24/2012   ALT 18 03/28/2015   AST 22 03/28/2015   NA 136 03/28/2015   K 3.7 03/28/2015   CL 103 03/28/2015   CREATININE 0.90 03/28/2015   BUN 7 03/28/2015   CO2 24 03/28/2015   TSH 0.35 11/25/2013   INR 1.08 05/12/2012   HGBA1C 5.1 09/24/2012    No results found.  Assessment & Plan:   Tosh was seen today for osteoarthritis.  Diagnoses and all orders for this visit:  Lumbar disc herniation with radiculopathy -     Discontinue: Oxycodone HCl 10 MG TABS; Take 1 tablet (10 mg total) by mouth 3 (three) times daily as needed. -     Discontinue: Oxycodone HCl 10 MG TABS; Take 1 tablet (10 mg total) by mouth 3 (three) times daily as needed. -     Oxycodone HCl 10 MG TABS; Take 1 tablet (10 mg total) by mouth 3 (three) times daily as needed.  Right knee pain -     Discontinue: Oxycodone HCl 10 MG TABS; Take 1 tablet (10 mg total) by mouth 3 (three) times daily as needed. -     Discontinue: Oxycodone HCl 10 MG TABS; Take 1 tablet (10 mg total) by mouth 3 (three) times daily as needed. -     Oxycodone HCl 10 MG TABS; Take 1 tablet (10 mg total) by mouth 3 (three) times daily as needed. -     Ambulatory referral to Orthopedic Surgery  Primary osteoarthritis of right ankle -     Discontinue: Oxycodone HCl 10 MG TABS; Take 1 tablet (10 mg total) by mouth 3 (three) times daily as needed. -     Discontinue: Oxycodone HCl 10 MG TABS; Take 1 tablet (10 mg total) by mouth 3 (three) times daily as needed. -     Oxycodone HCl 10 MG TABS; Take 1 tablet (10 mg total) by mouth 3 (three) times daily as needed.  Mass of hand, left -     Ambulatory referral to Orthopedic Surgery  I have discontinued Mr. Bettendorf's oxyCODONE. I am also having him maintain his ibuprofen, Suvorexant, and Oxycodone HCl.  Meds  ordered this encounter  Medications  . DISCONTD: Oxycodone HCl 10 MG TABS    Sig: Take 1 tablet (10 mg total) by mouth 3 (three) times daily as needed.    Dispense:  90 tablet    Refill:  0  . DISCONTD: Oxycodone HCl 10 MG TABS    Sig: Take 1 tablet (10 mg total) by mouth 3 (three) times daily as needed.    Dispense:  90 tablet    Refill:  0  . Oxycodone HCl 10 MG TABS    Sig: Take 1 tablet (10 mg total) by mouth 3 (three) times daily as needed.    Dispense:  90 tablet    Refill:  0     Follow-up: Return in about 3 months (around 10/13/2015).  Sanda Linger, MD

## 2015-07-15 NOTE — Patient Instructions (Signed)

## 2015-08-13 ENCOUNTER — Telehealth: Payer: Self-pay | Admitting: *Deleted

## 2015-08-13 NOTE — Telephone Encounter (Signed)
Pharmacist Amil Amen) left msg on triage stating needing to verify rx for Oxycodone, and is it mandatory for controls to be written on rx paper. Called Amil Amen back verified rx that was written 07/15/15. Inform her that we do not use the rx paper at this office. Not mandatory to use in [place]...Raechel Chute

## 2015-10-19 ENCOUNTER — Ambulatory Visit: Payer: 59 | Admitting: Internal Medicine

## 2015-10-19 DIAGNOSIS — Z0289 Encounter for other administrative examinations: Secondary | ICD-10-CM

## 2015-10-21 ENCOUNTER — Telehealth: Payer: Self-pay | Admitting: Internal Medicine

## 2015-10-21 NOTE — Telephone Encounter (Signed)
Patient no showed for med fu on 4/3.  Please advise.

## 2015-10-26 ENCOUNTER — Telehealth: Payer: Self-pay | Admitting: Internal Medicine

## 2015-10-26 NOTE — Telephone Encounter (Signed)
Pt is self pay and no showed his last apt please advise

## 2015-10-26 NOTE — Telephone Encounter (Signed)
Patient is requesting pain meds before his appt on Thursday.

## 2015-10-27 NOTE — Telephone Encounter (Signed)
Pt called in to check the status of his message. Advised that message has been sent. Currently awaiting providers approval. Pt expressed understanding.

## 2015-10-29 ENCOUNTER — Encounter: Payer: Self-pay | Admitting: Internal Medicine

## 2015-10-29 ENCOUNTER — Ambulatory Visit (INDEPENDENT_AMBULATORY_CARE_PROVIDER_SITE_OTHER): Payer: Self-pay | Admitting: Internal Medicine

## 2015-10-29 ENCOUNTER — Ambulatory Visit (INDEPENDENT_AMBULATORY_CARE_PROVIDER_SITE_OTHER)
Admission: RE | Admit: 2015-10-29 | Discharge: 2015-10-29 | Disposition: A | Discharge status: Home / Self Care | Payer: Self-pay | Source: Ambulatory Visit | Attending: Internal Medicine | Admitting: Internal Medicine

## 2015-10-29 VITALS — BP 128/80 | HR 67 | Temp 98.6°F | Resp 16 | Ht 72.0 in | Wt 201.0 lb

## 2015-10-29 DIAGNOSIS — G8929 Other chronic pain: Secondary | ICD-10-CM

## 2015-10-29 DIAGNOSIS — M25562 Pain in left knee: Secondary | ICD-10-CM

## 2015-10-29 DIAGNOSIS — M5116 Intervertebral disc disorders with radiculopathy, lumbar region: Secondary | ICD-10-CM

## 2015-10-29 DIAGNOSIS — M25569 Pain in unspecified knee: Secondary | ICD-10-CM

## 2015-10-29 DIAGNOSIS — R200 Anesthesia of skin: Secondary | ICD-10-CM | POA: Insufficient documentation

## 2015-10-29 DIAGNOSIS — R208 Other disturbances of skin sensation: Secondary | ICD-10-CM

## 2015-10-29 DIAGNOSIS — M19071 Primary osteoarthritis, right ankle and foot: Secondary | ICD-10-CM

## 2015-10-29 DIAGNOSIS — M25561 Pain in right knee: Secondary | ICD-10-CM

## 2015-10-29 MED ORDER — OXYCODONE HCL 10 MG PO TABS: 10.0000 mg | ORAL_TABLET | Freq: Three times a day (TID) | ORAL | Status: DC | PRN

## 2015-10-29 NOTE — Progress Notes (Signed)
Subjective:  Patient ID: David Shelton, male    DOB: 1971/06/21  Age: 45 y.o. MRN: [id number]  CC: Osteoarthritis   HPI David Shelton for a follow-up and refill on pain medications-he complains of persistent pain in his left knee that he says has worsened over the last 2-3 months. He denies any recent trauma or injury. He feels like there is a decreased range of motion in the knee but he never notices any swelling. He is currently uninsured so he is not interested in being referred to an orthopedist to consider having knee surgery if necessary. He also complains of intermittent low back pain. His main complaint today is a 2 month history of numbness and tingling in his right foot. He says he feels like sometimes the foot "goes to sleep". He denies claudication. He previously had a DVT in the right lower extremity after undergoing right knee surgery several years ago. He has had no recent calf pain or lower extremity swelling.  Outpatient Prescriptions Prior to Visit  Medication Sig Dispense Refill  . ibuprofen (ADVIL,MOTRIN) 800 MG tablet Take 1 tablet (800 mg total) by mouth 3 (three) times daily. (Patient taking differently: Take 800 mg by mouth every 8 (eight) hours as needed (pain). ) 90 tablet 5  . Oxycodone HCl 10 MG TABS Take 1 tablet (10 mg total) by mouth 3 (three) times daily as needed. 90 tablet 0  . Suvorexant (BELSOMRA) 20 MG TABS Take 1 tablet by mouth at bedtime as needed. 30 tablet 5   No facility-administered medications prior to visit.    ROS Review of Systems  Constitutional: Negative.   HENT: Negative.   Eyes: Negative.   Respiratory: Negative.  Negative for cough and shortness of breath.   Cardiovascular: Negative.  Negative for chest pain.  Gastrointestinal: Positive for constipation. Negative for abdominal pain.  Endocrine: Negative.   Genitourinary: Negative.  Negative for difficulty urinating.  Musculoskeletal: Positive for back pain and arthralgias.  Negative for myalgias and gait problem.  Skin: Negative.  Negative for color change and rash.  Allergic/Immunologic: Negative.   Neurological: Positive for numbness. Negative for dizziness, weakness, light-headedness and headaches.  Hematological: Negative.  Negative for adenopathy. Does not bruise/bleed easily.  Psychiatric/Behavioral: Negative.     Objective:  BP 128/80 mmHg  Pulse 67  Temp(Src) 98.6 F (37 C) (Oral)  Ht 6' (1.829 m)  Wt 201 lb (91.173 kg)  BMI 27.25 kg/m2  SpO2 97%  BP Readings from Last 3 Encounters:  10/29/15 128/80  07/15/15 140/86  03/28/15 134/62    Wt Readings from Last 3 Encounters:  10/29/15 201 lb (91.173 kg)  07/15/15 204 lb (92.534 kg)  12/30/14 191 lb 12 oz (86.977 kg)    Physical Exam  Constitutional: He is oriented to person, place, and time. No distress.  HENT:  Mouth/Throat: Oropharynx is clear and moist. No oropharyngeal exudate.  Eyes: Conjunctivae are normal. Right eye exhibits no discharge. Left eye exhibits no discharge. No scleral icterus.  Neck: Normal range of motion. Neck supple. No JVD present. No tracheal deviation present. No thyromegaly present.  Cardiovascular: Normal rate, regular rhythm, normal heart sounds and intact distal pulses.  Exam reveals no gallop and no friction rub.   No murmur heard. Pulmonary/Chest: Effort normal and breath sounds normal. No stridor. No respiratory distress. He has no wheezes. He has no rales. He exhibits no tenderness.  Abdominal: Soft. Bowel sounds are normal. He exhibits no distension and no mass. There is no  tenderness. There is no rebound and no guarding.  Musculoskeletal: He exhibits no edema or tenderness.       Right knee: Normal.       Left knee: He exhibits decreased range of motion. He exhibits no swelling, no effusion, no deformity, no laceration, no erythema, normal alignment and no bony tenderness. No tenderness found.       Lumbar back: Normal.  Lymphadenopathy:    He has no  cervical adenopathy.  Neurological: He is alert and oriented to person, place, and time. He has normal strength. He displays no atrophy, no tremor and normal reflexes. No cranial nerve deficit or sensory deficit. He exhibits normal muscle tone. He displays a negative Romberg sign. He displays no seizure activity. Coordination and gait normal. He displays no Babinski's sign on the right side. He displays no Babinski's sign on the left side.  Reflex Scores:      Tricep reflexes are 1+ on the right side and 1+ on the left side.      Bicep reflexes are 1+ on the right side and 1+ on the left side.      Brachioradialis reflexes are 1+ on the right side and 1+ on the left side.      Patellar reflexes are 1+ on the right side and 1+ on the left side.      Achilles reflexes are 1+ on the right side and 1+ on the left side. Neg SLR in BLE  Skin: Skin is warm and dry. No rash noted. He is not diaphoretic. No erythema. No pallor.  Psychiatric: He has a normal mood and affect. His behavior is normal. Judgment and thought content normal.  Vitals reviewed.   Lab Results  Component Value Date   WBC 5.3 03/28/2015   HGB 13.6 03/28/2015   HCT 38.3* 03/28/2015   PLT 197 03/28/2015   GLUCOSE 85 03/28/2015   CHOL 130 09/24/2012   TRIG 151.0* 09/24/2012   HDL 45.70 09/24/2012   LDLCALC 54 09/24/2012   ALT 18 03/28/2015   AST 22 03/28/2015   NA 136 03/28/2015   K 3.7 03/28/2015   CL 103 03/28/2015   CREATININE 0.90 03/28/2015   BUN 7 03/28/2015   CO2 24 03/28/2015   TSH 0.35 11/25/2013   INR 1.08 05/12/2012   HGBA1C 5.1 09/24/2012    No results found.  Assessment & Plan:   David Shelton was seen today for osteoarthritis.  Diagnoses and all orders for this visit:  Right leg numbness- his neuro exam is unremarkable but he is concerned that there is something wrong with his right lower leg that is being missed, I have ordered an NCS/EMG to identify any neurological disorder that would cause his right  lower extremity feel numb and tingly. -     Ambulatory referral to Neurology  Knee pain, chronic, left- examination shows decreased range of motion, plain film is normal, continue oxycodone as needed and will refer to orthopedics when he addresses his financial situation. -     DG Knee Complete 4 Views Left; Future -     Oxycodone HCl 10 MG TABS; Take 1 tablet (10 mg total) by mouth 3 (three) times daily as needed.  Lumbar disc herniation with radiculopathy- there is no evidence radiculopathy at this time, will continue pain management with oxycodone and I've ordered an in CS-EMG to see if he has a radiculopathy -     Oxycodone HCl 10 MG TABS; Take 1 tablet (10 mg total) by mouth  3 (three) times daily as needed.  Right knee pain -     Oxycodone HCl 10 MG TABS; Take 1 tablet (10 mg total) by mouth 3 (three) times daily as needed.  Primary osteoarthritis of right ankle -     Oxycodone HCl 10 MG TABS; Take 1 tablet (10 mg total) by mouth 3 (three) times daily as needed.   I have discontinued Mr. Frein's Suvorexant. I am also having him maintain his ibuprofen and Oxycodone HCl.  Meds ordered this encounter  Medications  . Oxycodone HCl 10 MG TABS    Sig: Take 1 tablet (10 mg total) by mouth 3 (three) times daily as needed.    Dispense:  90 tablet    Refill:  0     Follow-up: Return in about 4 weeks (around 11/26/2015).  Sanda Linger, MD

## 2015-10-29 NOTE — Progress Notes (Signed)
Pre visit review using our clinic review tool, if applicable. No additional management support is needed unless otherwise documented below in the visit note. 

## 2015-10-29 NOTE — Patient Instructions (Signed)

## 2015-11-23 ENCOUNTER — Telehealth: Payer: Self-pay | Admitting: *Deleted

## 2015-11-23 DIAGNOSIS — G8929 Other chronic pain: Secondary | ICD-10-CM

## 2015-11-23 DIAGNOSIS — M5116 Intervertebral disc disorders with radiculopathy, lumbar region: Secondary | ICD-10-CM

## 2015-11-23 DIAGNOSIS — M25561 Pain in right knee: Secondary | ICD-10-CM

## 2015-11-23 DIAGNOSIS — M25562 Pain in left knee: Secondary | ICD-10-CM

## 2015-11-23 DIAGNOSIS — M19071 Primary osteoarthritis, right ankle and foot: Secondary | ICD-10-CM

## 2015-11-23 MED ORDER — OXYCODONE HCL 10 MG PO TABS: 10.0000 mg | ORAL_TABLET | Freq: Three times a day (TID) | ORAL | Status: DC | PRN

## 2015-11-23 NOTE — Telephone Encounter (Signed)
Left msg on triage stating needing refill on his Oxycodone. MD is out of the office this week pls advise...Raechel Chute/lmb

## 2015-11-23 NOTE — Telephone Encounter (Signed)
Notified pt rx ready for pick-up.../lmb 

## 2015-11-23 NOTE — Telephone Encounter (Signed)
OK to fill this prescription with additional refills x0 F/u w/Dr Yetta BarreJones Thank you!

## 2015-12-15 ENCOUNTER — Encounter: Payer: Self-pay | Admitting: Internal Medicine

## 2015-12-15 ENCOUNTER — Ambulatory Visit (INDEPENDENT_AMBULATORY_CARE_PROVIDER_SITE_OTHER): Payer: Self-pay | Admitting: Internal Medicine

## 2015-12-15 VITALS — BP 138/90 | HR 68 | Temp 98.6°F | Resp 16 | Ht 72.0 in | Wt 205.0 lb

## 2015-12-15 DIAGNOSIS — G8929 Other chronic pain: Secondary | ICD-10-CM

## 2015-12-15 DIAGNOSIS — M25562 Pain in left knee: Secondary | ICD-10-CM

## 2015-12-15 DIAGNOSIS — M5116 Intervertebral disc disorders with radiculopathy, lumbar region: Secondary | ICD-10-CM

## 2015-12-15 DIAGNOSIS — M19071 Primary osteoarthritis, right ankle and foot: Secondary | ICD-10-CM

## 2015-12-15 DIAGNOSIS — M25561 Pain in right knee: Secondary | ICD-10-CM

## 2015-12-15 MED ORDER — OXYCODONE HCL 10 MG PO TABS: 10.0000 mg | ORAL_TABLET | Freq: Three times a day (TID) | ORAL | Status: DC | PRN

## 2015-12-15 MED ORDER — IBUPROFEN 800 MG PO TABS: 800.0000 mg | ORAL_TABLET | Freq: Three times a day (TID) | ORAL | Status: DC | PRN

## 2015-12-15 NOTE — Patient Instructions (Signed)

## 2015-12-15 NOTE — Progress Notes (Signed)
Subjective:  Patient ID: David Shelton, male    DOB: 05-Jan-1971  Age: 45 y.o. MRN: [id number]  CC: Osteoarthritis   HPI David Shelton presents for follow-up on chronic pain, arthritis, prior history of DVT, and recent workup for neuropathy. At the time of this appointment he has not had the money to have a NCS/EMG study performed but he agrees to get it done sometime in the future. 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 He complains of persistent numbness in his right foot over 3 of the toes.  Outpatient Prescriptions Prior to Visit  Medication Sig Dispense Refill  . ibuprofen (ADVIL,MOTRIN) 800 MG tablet Take 1 tablet (800 mg total) by mouth 3 (three) times daily. (Patient taking differently: Take 800 mg by mouth every 8 (eight) hours as needed (pain). ) 90 tablet 5  . Oxycodone HCl 10 MG TABS Take 1 tablet (10 mg total) by mouth 3 (three) times daily as needed. May fill on or after 11/28/15 90 tablet 0   No facility-administered medications prior to visit.    ROS Review of Systems  Constitutional: Negative.  Negative for chills, diaphoresis and fatigue.  HENT: Negative.   Eyes: Negative.   Respiratory: Negative.  Negative for cough, choking, chest tightness, shortness of breath and stridor.   Cardiovascular: Negative.  Negative for chest pain, palpitations and leg swelling.  Gastrointestinal: Negative.  Negative for nausea, vomiting, abdominal pain, diarrhea and constipation.  Endocrine: Negative.   Genitourinary: Negative.  Negative for urgency, hematuria, flank pain and difficulty urinating.  Musculoskeletal: Positive for back pain and arthralgias. Negative for myalgias, joint swelling and neck pain.  Skin: Negative.  Negative for color change and rash.  Allergic/Immunologic: Negative.   Neurological: Positive for numbness. Negative for dizziness, tremors, weakness and light-headedness.    Hematological: Negative.  Negative for adenopathy. Does not bruise/bleed easily.  Psychiatric/Behavioral: Negative.     Objective:  BP 138/90 mmHg  Pulse 68  Temp(Src) 98.6 F (37 C) (Oral)  Resp 16  Ht 6' (1.829 m)  Wt 205 lb (92.987 kg)  BMI 27.80 kg/m2  SpO2 95%  BP Readings from Last 3 Encounters:  12/15/15 138/90  10/29/15 128/80  07/15/15 140/86    Wt Readings from Last 3 Encounters:  12/15/15 205 lb (92.987 kg)  10/29/15 201 lb (91.173 kg)  07/15/15 204 lb (92.534 kg)    Physical Exam  Constitutional: He is oriented to person, place, and time. No distress.  HENT:  Mouth/Throat: Oropharynx is clear and moist. No oropharyngeal exudate.  Eyes: Conjunctivae are normal. Right eye exhibits no discharge. Left eye exhibits no discharge. No scleral icterus.  Neck: Normal range of motion. Neck supple. No JVD present. No tracheal deviation present. No thyromegaly present.  Cardiovascular: Normal rate, regular rhythm, normal heart sounds and intact distal pulses.  Exam reveals no gallop and no friction rub.   No murmur heard. Pulmonary/Chest: Effort normal and breath sounds normal. No stridor. No respiratory distress. He has no wheezes. He has no rales. He exhibits no tenderness.  Abdominal: Soft. Bowel sounds are normal. He exhibits no distension and no mass. There is no tenderness. There is no rebound and no guarding.  Musculoskeletal: Normal range of motion. He exhibits no edema or tenderness.       Right knee: Normal.       Left knee: Normal.  Lymphadenopathy:    He has no cervical adenopathy.  Neurological: He is oriented to person, place, and time.  Skin: Skin is warm and dry. No rash noted. He is not diaphoretic. No erythema. No pallor.  Vitals reviewed.   Lab Results  Component Value Date   WBC 5.3 03/28/2015   HGB 13.6 03/28/2015   HCT 38.3* 03/28/2015   PLT 197 03/28/2015   GLUCOSE 85 03/28/2015   CHOL 130 09/24/2012   TRIG 151.0* 09/24/2012   HDL 45.70  09/24/2012   LDLCALC 54 09/24/2012   ALT 18 03/28/2015   AST 22 03/28/2015   NA 136 03/28/2015   K 3.7 03/28/2015   CL 103 03/28/2015   CREATININE 0.90 03/28/2015   BUN 7 03/28/2015   CO2 24 03/28/2015   TSH 0.35 11/25/2013   INR 1.08 05/12/2012   HGBA1C 5.1 09/24/2012    Dg Knee Complete 4 Views Left  10/29/2015  CLINICAL DATA:  Left posterior knee pain for 1 month, no known injury EXAM: LEFT KNEE - COMPLETE 4+ VIEW COMPARISON:  02/21/2007 FINDINGS: Four views of the left knee submitted. No acute fracture or subluxation. No radiopaque foreign body. No evidence of joint effusion. IMPRESSION: Negative. Electronically Signed   By: Natasha Mead M.D.   On: 10/29/2015 16:51    Assessment & Plan:   David Shelton was seen today for osteoarthritis.  Diagnoses and all orders for this visit:  Primary osteoarthritis of right ankle -     ibuprofen (ADVIL,MOTRIN) 800 MG tablet; Take 1 tablet (800 mg total) by mouth every 8 (eight) hours as needed (pain). -     Discontinue: Oxycodone HCl 10 MG TABS; Take 1 tablet (10 mg total) by mouth 3 (three) times daily as needed. May fill on or after 12/15/15 -     Discontinue: Oxycodone HCl 10 MG TABS; Take 1 tablet (10 mg total) by mouth 3 (three) times daily as needed. May fill on or after 01/15/16 -     Oxycodone HCl 10 MG TABS; Take 1 tablet (10 mg total) by mouth 3 (three) times daily as needed. May fill on or after 02/14/16  Right knee pain -     ibuprofen (ADVIL,MOTRIN) 800 MG tablet; Take 1 tablet (800 mg total) by mouth every 8 (eight) hours as needed (pain). -     Discontinue: Oxycodone HCl 10 MG TABS; Take 1 tablet (10 mg total) by mouth 3 (three) times daily as needed. May fill on or after 12/15/15 -     Discontinue: Oxycodone HCl 10 MG TABS; Take 1 tablet (10 mg total) by mouth 3 (three) times daily as needed. May fill on or after 01/15/16 -     Oxycodone HCl 10 MG TABS; Take 1 tablet (10 mg total) by mouth 3 (three) times daily as needed. May fill on or  after 02/14/16  Lumbar disc herniation with radiculopathy -     ibuprofen (ADVIL,MOTRIN) 800 MG tablet; Take 1 tablet (800 mg total) by mouth every 8 (eight) hours as needed (pain). -     Discontinue: Oxycodone HCl 10 MG TABS; Take 1 tablet (10 mg total) by mouth 3 (three) times daily as needed. May fill on or after 12/15/15 -     Discontinue: Oxycodone HCl 10 MG TABS; Take 1 tablet (10 mg total) by mouth 3 (three) times daily as needed. May fill on or after 01/15/16 -     Oxycodone HCl 10 MG TABS; Take 1 tablet (10 mg total) by mouth 3 (three) times daily as needed. May fill on or  after 02/14/16  Knee pain, chronic, left -     ibuprofen (ADVIL,MOTRIN) 800 MG tablet; Take 1 tablet (800 mg total) by mouth every 8 (eight) hours as needed (pain). -     Discontinue: Oxycodone HCl 10 MG TABS; Take 1 tablet (10 mg total) by mouth 3 (three) times daily as needed. May fill on or after 12/15/15 -     Discontinue: Oxycodone HCl 10 MG TABS; Take 1 tablet (10 mg total) by mouth 3 (three) times daily as needed. May fill on or after 01/15/16 -     Oxycodone HCl 10 MG TABS; Take 1 tablet (10 mg total) by mouth 3 (three) times daily as needed. May fill on or after 02/14/16  I have discontinued David Shelton's Oxycodone HCl and Oxycodone HCl. I have also changed his ibuprofen and Oxycodone HCl.  Meds ordered this encounter  Medications  . ibuprofen (ADVIL,MOTRIN) 800 MG tablet    Sig: Take 1 tablet (800 mg total) by mouth every 8 (eight) hours as needed (pain).    Dispense:  90 tablet    Refill:  5  . DISCONTD: Oxycodone HCl 10 MG TABS    Sig: Take 1 tablet (10 mg total) by mouth 3 (three) times daily as needed. May fill on or after 12/15/15    Dispense:  90 tablet    Refill:  0  . DISCONTD: Oxycodone HCl 10 MG TABS    Sig: Take 1 tablet (10 mg total) by mouth 3 (three) times daily as needed. May fill on or after 01/15/16    Dispense:  90 tablet    Refill:  0  . Oxycodone HCl 10 MG TABS    Sig: Take 1 tablet (10 mg  total) by mouth 3 (three) times daily as needed. May fill on or after 02/14/16    Dispense:  90 tablet    Refill:  0     Follow-up: Return in about 3 months (around 03/16/2016).  Sanda Linger, MD

## 2015-12-15 NOTE — Progress Notes (Signed)
Pre visit review using our clinic review tool, if applicable. No additional management support is needed unless otherwise documented below in the visit note. 

## 2016-03-18 ENCOUNTER — Telehealth: Payer: Self-pay | Admitting: *Deleted

## 2016-03-18 NOTE — Telephone Encounter (Signed)
Pt left msg on triage stating some kind of way he through his back out, and is in a lot of pain. Wanting to know if he can get refill on the Oxycodone...Raechel Chute/lmb

## 2016-03-20 ENCOUNTER — Emergency Department (HOSPITAL_COMMUNITY)
Admission: EM | Admit: 2016-03-20 | Discharge: 2016-03-20 | Disposition: A | Discharge status: Home / Self Care | Payer: Self-pay | Attending: Emergency Medicine | Admitting: Emergency Medicine

## 2016-03-20 ENCOUNTER — Encounter (HOSPITAL_COMMUNITY): Payer: Self-pay | Admitting: Nurse Practitioner

## 2016-03-20 DIAGNOSIS — M545 Low back pain, unspecified: Secondary | ICD-10-CM

## 2016-03-20 DIAGNOSIS — F17210 Nicotine dependence, cigarettes, uncomplicated: Secondary | ICD-10-CM | POA: Insufficient documentation

## 2016-03-20 MED ORDER — METHOCARBAMOL 500 MG PO TABS
1000.0000 mg | ORAL_TABLET | Freq: Once | ORAL | Status: AC
  Administered 2016-03-20: 1000 mg via ORAL
  Filled 2016-03-20: qty 2

## 2016-03-20 MED ORDER — METHOCARBAMOL 500 MG PO TABS: 500.0000 mg | ORAL_TABLET | Freq: Two times a day (BID) | ORAL | 0 refills | Status: DC

## 2016-03-20 MED ORDER — OXYCODONE-ACETAMINOPHEN 5-325 MG PO TABS
2.0000 | ORAL_TABLET | Freq: Once | ORAL | Status: AC
  Administered 2016-03-20: 2 via ORAL
  Filled 2016-03-20: qty 2

## 2016-03-20 MED ORDER — KETOROLAC TROMETHAMINE 60 MG/2ML IM SOLN
60.0000 mg | Freq: Once | INTRAMUSCULAR | Status: AC
  Administered 2016-03-20: 60 mg via INTRAMUSCULAR
  Filled 2016-03-20: qty 2

## 2016-03-20 MED ORDER — LIDOCAINE 5 % EX PTCH: 1.0000 | MEDICATED_PATCH | CUTANEOUS | 0 refills | Status: DC

## 2016-03-20 MED ORDER — NAPROXEN 500 MG PO TABS: 500.0000 mg | ORAL_TABLET | Freq: Two times a day (BID) | ORAL | 0 refills | Status: DC

## 2016-03-20 MED ORDER — OXYCODONE-ACETAMINOPHEN 5-325 MG PO TABS: 1.0000 | ORAL_TABLET | ORAL | 0 refills | Status: DC | PRN

## 2016-03-20 MED ORDER — PREDNISONE 10 MG (21) PO TBPK: 10.0000 mg | ORAL_TABLET | Freq: Every day | ORAL | 0 refills | Status: DC

## 2016-03-20 NOTE — Discharge Instructions (Signed)
Take it easy, but do not lay around too much as this may make the stiffness worse. Take 500 mg of naproxen every 12 hours or 800 mg of ibuprofen every 8 hours for the next 3 days. Take these medications with food to avoid upset stomach. Robaxin is a muscle relaxer and may help loosen stiff muscles. Percocet for severe pain. Do not take the Robaxin or Percocet while driving or performing other dangerous activities. Prednisone is also provided to reduce inflammation. Take as directed on the box.  Follow-up with orthopedics as soon as possible. Use the information provided the set up an appointment.

## 2016-03-20 NOTE — ED Provider Notes (Signed)
[facility]-EMERGENCY DEPT Provider Note   CSN: [id number] Arrival date & time: 03/20/16  1056  By signing my name below, I, Gasper Sells Pinson, attest that this documentation has been prepared under the direction and in the presence of Shakti Fleer, PA-C. Electronically Signed: Javier Docker, ER Scribe. 02/27/2016. 12:19 PM.   History   Chief Complaint Chief Complaint  Patient presents with  . Back Pain    The history is provided by the patient. No language interpreter was used.    HPI Comments: David Shelton is a 45 y.o. male who presents to the Emergency Department complaining of non radiating bilateral low back pain. He has a hx of chronic low back pain and has regularly used oxycodone for pain management. 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 His pain is better with laying supine, worse with bending over or standing. He had an MRI 3-4 years ago, but can not say what the results were. Patient states that he is out of his oxycodone. Denies neuro deficits, changes in bowel or bladder function, fever/chills, abdominal pain, or any other complaints.    Past Medical History:  Diagnosis Date  . Anemia   . Arthritis   . DVT (deep venous thrombosis) (HCC)   . Thyroid disease   . TMJ syndrome     Patient Active Problem List   Diagnosis Date Noted  . Right leg numbness 10/29/2015  . Knee pain, chronic 10/29/2015  . Mass of finger of left hand 07/15/2015  . Numbness and tingling of right leg 05/08/2014  . 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 Mass of left hand 05/08/2014  . Right knee pain 08/14/2013  . Tobacco abuse 02/04/2013  . 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 Somnolence, daytime 10/11/2012  . Low TSH level 09/24/2012  . Other abnormal glucose 09/24/2012  . Migraine headache 07/03/2012  . DVT (deep venous thrombosis) (HCC) 05/15/2012  . OA (osteoarthritis) of ankle 05/15/2012    Past Surgical History:  Procedure Laterality Date  . ARTHROSCOPIC  REPAIR ACL    . BILATERAL TEMPOROMANDIBULAR JOINT ARTHROPLASTY       Home Medications    Prior to Admission medications   Medication Sig Start Date End Date Taking? Authorizing Provider  ibuprofen (ADVIL,MOTRIN) 800 MG tablet Take 1 tablet (800 mg total) by mouth every 8 (eight) hours as needed (pain). 12/15/15   Etta Grandchild, MD  lidocaine (LIDODERM) 5 % Place 1 patch onto the skin daily. Remove & Discard patch within 12 hours or as directed by MD 03/20/16   Anselm Pancoast, PA-C  methocarbamol (ROBAXIN) 500 MG tablet Take 1 tablet (500 mg total) by mouth 2 (two) times daily. 03/20/16   Rorey Bisson C Geral Tuch, PA-C  naproxen (NAPROSYN) 500 MG tablet Take 1 tablet (500 mg total) by mouth 2 (two) times daily. 03/20/16   Tip Atienza C Patton Rabinovich, PA-C  Oxycodone HCl 10 MG TABS Take 1 tablet (10 mg total) by mouth 3 (three) times daily as needed. May fill on or after 02/14/16 12/15/15   Etta Grandchild, MD  oxyCODONE-acetaminophen (PERCOCET/ROXICET) 5-325 MG tablet Take 1-2 tablets by mouth every 4 (four) hours as needed for severe pain. 03/20/16   Lonzie Simmer C Kittie Krizan, PA-C  predniSONE (STERAPRED UNI-PAK 21 TAB) 10 MG (21) TBPK tablet Take 1 tablet (10 mg total) by mouth daily. Take 6 tabs by mouth daily  for 2 days, then 5 tabs for 2 days, then 4 tabs for 2 days,  then 3 tabs for 2 days, 2 tabs for 2 days, then 1 tab by mouth daily for 2 days 03/20/16   Anselm PancoastShawn C Evadna Donaghy, PA-C    Family History Family History  Problem Relation Age of Onset  . Arthritis Mother   . Hypertension Father   . Alcohol abuse Neg Hx   . Cancer Neg Hx   . Heart disease Neg Hx   . Hyperlipidemia Neg Hx   . Kidney disease Neg Hx   . Stroke Neg Hx   . Clotting disorder Neg Hx     Social History Social History  Substance Use Topics  . Smoking status: Current Every Day Smoker    Types: Cigarettes  . Smokeless tobacco: Never Used  . Alcohol use No     Allergies   Review of patient's allergies indicates no known allergies.   Review of Systems Review of  Systems  Constitutional: Negative for chills and fever.  Genitourinary: Negative for flank pain.  Musculoskeletal: Positive for back pain.  Skin: Negative for color change and wound.  Neurological: Negative for weakness and numbness.  All other systems reviewed and are negative.   Physical Exam Updated Vital Signs BP 140/84   Pulse 61   Temp 98.3 F (36.8 C) (Oral)   Resp 18   SpO2 98%   Physical Exam  Constitutional: He appears well-developed and well-nourished. No distress.  HENT:  Head: Normocephalic and atraumatic.  Eyes: Conjunctivae are normal.  Neck: Neck supple.  Cardiovascular: Normal rate, regular rhythm, normal heart sounds and intact distal pulses.   Pulmonary/Chest: Effort normal and breath sounds normal. No respiratory distress.  Abdominal: Soft. There is no tenderness. There is no guarding.  Musculoskeletal: He exhibits no edema or tenderness.  TTP the erector spina flanking the lumbar spine bilaterally. No midline tenderness, no step off or deformity in the spine.  Motor function intact in all extremities.  Lymphadenopathy:    He has no cervical adenopathy.  Neurological: He is alert. He has normal reflexes.  No sensory deficits. Strength 5/5 in all extremities. No gait disturbance. Coordination intact.   Skin: Skin is warm and dry. He is not diaphoretic.  Psychiatric: He has a normal mood and affect. His behavior is normal.  Nursing note and vitals reviewed.   ED Treatments / Results  DIAGNOSTIC STUDIES: Oxygen Saturation is 98% on RA, normal by my interpretation.    COORDINATION OF CARE: 12:20 PM Discussed treatment plan with pt at bedside which includes prednisone, pain patches and pt agreed to plan.  Labs (all labs ordered are listed, but only abnormal results are displayed) Labs Reviewed - No data to display  EKG  EKG Interpretation None       Radiology No results found.  Procedures Procedures (including critical care  time)  Medications Ordered in ED Medications  ketorolac (TORADOL) injection 60 mg (60 mg Intramuscular Given 03/20/16 1233)  oxyCODONE-acetaminophen (PERCOCET/ROXICET) 5-325 MG per tablet 2 tablet (2 tablets Oral Given 03/20/16 1231)  methocarbamol (ROBAXIN) tablet 1,000 mg (1,000 mg Oral Given 03/20/16 1232)     Initial Impression / Assessment and Plan / ED Course  I have reviewed the triage vital signs and the nursing notes.  Pertinent labs & imaging results that were available during my care of the patient were reviewed by me and considered in my medical decision making (see chart for details).  Clinical Course   Patient with acute on chronic back pain. No red flag symptoms. Patient is out of his  chronic pain medicines. Search in [place] controlled database reveals that the pt is regularly gotten oxycodone 10mg  tablets since April. The last dispense was 90 tablets on the 30th of July. Orthopedic referral. Patient ambulated around the department without difficulty or assistance. Return precautions discussed.    Vitals:   03/20/16 1122 03/20/16 1356  BP: 140/84 134/76  Pulse: 61 (!) 53  Resp: 18 16  Temp: 98.3 F (36.8 C)   TempSrc: Oral   SpO2: 98% 100%     Final Clinical Impressions(s) / ED Diagnoses   Final diagnoses:  Bilateral low back pain without sciatica    New Prescriptions Discharge Medication List as of 03/20/2016  1:39 PM    START taking these medications   Details  lidocaine (LIDODERM) 5 % Place 1 patch onto the skin daily. Remove & Discard patch within 12 hours or as directed by MD, Starting Sun 03/20/2016, Print    methocarbamol (ROBAXIN) 500 MG tablet Take 1 tablet (500 mg total) by mouth 2 (two) times daily., Starting Sun 03/20/2016, Print    naproxen (NAPROSYN) 500 MG tablet Take 1 tablet (500 mg total) by mouth 2 (two) times daily., Starting Sun 03/20/2016, Print    oxyCODONE-acetaminophen (PERCOCET/ROXICET) 5-325 MG tablet Take 1-2 tablets by mouth every 4 (four)  hours as needed for severe pain., Starting Sun 03/20/2016, Print    predniSONE (STERAPRED UNI-PAK 21 TAB) 10 MG (21) TBPK tablet Take 1 tablet (10 mg total) by mouth daily. Take 6 tabs by mouth daily  for 2 days, then 5 tabs for 2 days, then 4 tabs for 2 days, then 3 tabs for 2 days, 2 tabs for 2 days, then 1 tab by mouth daily for 2 days, Starting Sun 03/20/2016, Print         I personally performed the services described in this documentation, which was scribed in my presence. The recorded information has been reviewed and is accurate.       Anselm Pancoast, PA-C 03/22/16 1740    Doug Sou, MD 03/24/16 1122

## 2016-03-20 NOTE — ED Notes (Signed)
Ambulated pt in hall pt no assistance needed. Pt slow to walk shuffling feet and slow  to move. No unsteadiness.

## 2016-03-20 NOTE — ED Notes (Signed)
C/o LBP. Hx of same. PCP [facility].

## 2016-03-20 NOTE — ED Notes (Signed)
'  I'm feeling 100% better".

## 2016-03-20 NOTE — ED Triage Notes (Signed)
Her c/o 3 week history of severe lower back pain. Pain radiates into upper back. Describes as stabbing shooting. He tried vicodin with no relief. He tried percocet with full relief, epsom salt soaks/heat/ice with some relief. He reports hx chronic back pain but the pain has never been this severe. He would also like his L hand checked while here. He is alert and breathing easily

## 2016-03-22 NOTE — Telephone Encounter (Signed)
Patient called back in regard. FYI:  Patient states he went to ER over the weekend.

## 2016-03-23 ENCOUNTER — Telehealth: Payer: Self-pay | Admitting: Emergency Medicine

## 2016-03-23 NOTE — Telephone Encounter (Signed)
Pt called again asking if he can get a refill on his oxycodone. Please advise thanks.

## 2016-03-24 ENCOUNTER — Other Ambulatory Visit: Payer: Self-pay | Admitting: Internal Medicine

## 2016-03-24 MED ORDER — OXYCODONE-ACETAMINOPHEN 5-325 MG PO TABS: 1.0000 | ORAL_TABLET | Freq: Three times a day (TID) | ORAL | 0 refills | Status: DC | PRN

## 2016-03-24 NOTE — Telephone Encounter (Signed)
ED rx'ed oxy 5-325 #8 on 03/20/2016   Last rx and OV with PCP was 12/15/2015  Please advise if refill is okay.

## 2016-03-24 NOTE — Telephone Encounter (Signed)
Pt informed rx is ready up front.

## 2016-03-24 NOTE — Telephone Encounter (Signed)
RX written 

## 2016-09-15 ENCOUNTER — Ambulatory Visit (HOSPITAL_COMMUNITY)
Admission: EM | Admit: 2016-09-15 | Discharge: 2016-09-15 | Disposition: A | Discharge status: Home / Self Care | Payer: Self-pay | Attending: Internal Medicine | Admitting: Internal Medicine

## 2016-09-15 ENCOUNTER — Encounter (HOSPITAL_COMMUNITY): Payer: Self-pay | Admitting: Emergency Medicine

## 2016-09-15 ENCOUNTER — Ambulatory Visit (INDEPENDENT_AMBULATORY_CARE_PROVIDER_SITE_OTHER): Payer: Self-pay

## 2016-09-15 DIAGNOSIS — M25562 Pain in left knee: Secondary | ICD-10-CM

## 2016-09-15 DIAGNOSIS — M19071 Primary osteoarthritis, right ankle and foot: Secondary | ICD-10-CM

## 2016-09-15 DIAGNOSIS — M25561 Pain in right knee: Secondary | ICD-10-CM

## 2016-09-15 DIAGNOSIS — L03011 Cellulitis of right finger: Secondary | ICD-10-CM

## 2016-09-15 DIAGNOSIS — M5116 Intervertebral disc disorders with radiculopathy, lumbar region: Secondary | ICD-10-CM

## 2016-09-15 DIAGNOSIS — M79644 Pain in right finger(s): Secondary | ICD-10-CM

## 2016-09-15 DIAGNOSIS — G8929 Other chronic pain: Secondary | ICD-10-CM

## 2016-09-15 MED ORDER — CEFTRIAXONE SODIUM 1 G IJ SOLR
1.0000 g | Freq: Once | INTRAMUSCULAR | Status: AC
  Administered 2016-09-15: 1 g via INTRAMUSCULAR

## 2016-09-15 MED ORDER — CEFTRIAXONE SODIUM 1 G IJ SOLR
INTRAMUSCULAR | Status: AC
  Filled 2016-09-15: qty 10

## 2016-09-15 MED ORDER — SULFAMETHOXAZOLE-TRIMETHOPRIM 800-160 MG PO TABS: 1.0000 | ORAL_TABLET | Freq: Two times a day (BID) | ORAL | 0 refills | Status: AC

## 2016-09-15 MED ORDER — OXYCODONE HCL 10 MG PO TABS: 10.0000 mg | ORAL_TABLET | Freq: Three times a day (TID) | ORAL | 0 refills | Status: AC | PRN

## 2016-09-15 NOTE — ED Provider Notes (Signed)
CSN: [id number]     Arrival date & time 09/15/16  1109 History   First MD Initiated Contact with Patient 09/15/16 1153     Chief Complaint  Patient presents with  . Finger Injury   (Consider location/radiation/quality/duration/timing/severity/associated sxs/prior Treatment) C/o severe pain in right 4th finger tip.  He was cutting a pipe 2 weeks ago and cut into his right 4th finger at the DIP joint and it has been progressively getting more painful.  His pain level is severe.  He is having throbbing in his right fingertip.     The history is provided by the patient.  Hand Pain  This is a new problem. The current episode started more than 1 week ago. The problem occurs constantly. The problem has not changed since onset.Nothing aggravates the symptoms. Nothing relieves the symptoms. He has tried nothing for the symptoms.    Past Medical History:  Diagnosis Date  . Anemia   . Arthritis   . DVT (deep venous thrombosis) (HCC)   . Thyroid disease   . TMJ syndrome    Past Surgical History:  Procedure Laterality Date  . ARTHROSCOPIC REPAIR ACL    . BILATERAL TEMPOROMANDIBULAR JOINT ARTHROPLASTY     Family History  Problem Relation Age of Onset  . Arthritis Mother   . Hypertension Father   . Alcohol abuse Neg Hx   . Cancer Neg Hx   . Heart disease Neg Hx   . Hyperlipidemia Neg Hx   . Kidney disease Neg Hx   . Stroke Neg Hx   . Clotting disorder Neg Hx    Social History  Substance Use Topics  . Smoking status: Current Every Day Smoker    Types: Cigarettes  . Smokeless tobacco: Never Used  . Alcohol use No    Review of Systems  Constitutional: Negative.   HENT: Negative.   Eyes: Negative.   Respiratory: Negative.   Cardiovascular: Negative.   Gastrointestinal: Negative.   Endocrine: Negative.   Genitourinary: Negative.   Musculoskeletal: Positive for arthralgias and joint swelling.  Allergic/Immunologic: Negative.   Neurological: Negative.   Hematological: Negative.    Psychiatric/Behavioral: Negative.     Allergies  Patient has no known allergies.  Home Medications   Prior to Admission medications   Medication Sig Start Date End Date Taking? Authorizing Provider  lidocaine (LIDODERM) 5 % Place 1 patch onto the skin daily. Remove & Discard patch within 12 hours or as directed by MD 03/20/16   Anselm Pancoast, PA-C  methocarbamol (ROBAXIN) 500 MG tablet Take 1 tablet (500 mg total) by mouth 2 (two) times daily. 03/20/16   Shawn C Joy, PA-C  naproxen (NAPROSYN) 500 MG tablet Take 1 tablet (500 mg total) by mouth 2 (two) times daily. 03/20/16   Shawn C Joy, PA-C  Oxycodone HCl 10 MG TABS Take 1 tablet (10 mg total) by mouth 3 (three) times daily as needed. May fill on or after 02/14/16 09/15/16   Deatra Canter, FNP  oxyCODONE-acetaminophen (PERCOCET/ROXICET) 5-325 MG tablet Take 1-2 tablets by mouth every 8 (eight) hours as needed for severe pain. 03/24/16   Etta Grandchild, MD  predniSONE (STERAPRED UNI-PAK 21 TAB) 10 MG (21) TBPK tablet Take 1 tablet (10 mg total) by mouth daily. Take 6 tabs by mouth daily  for 2 days, then 5 tabs for 2 days, then 4 tabs for 2 days, then 3 tabs for 2 days, 2 tabs for 2 days, then 1 tab by mouth daily for  2 days 03/20/16   Anselm PancoastShawn C Joy, PA-C  sulfamethoxazole-trimethoprim (BACTRIM DS,SEPTRA DS) 800-160 MG tablet Take 1 tablet by mouth 2 (two) times daily. 09/15/16 09/22/16  Deatra CanterWilliam J Remedy Corporan, FNP   Meds Ordered and Administered this Visit   Medications  cefTRIAXone (ROCEPHIN) injection 1 g (not administered)    BP 132/85 (BP Location: Left Arm)   Pulse 65   Temp 97.8 F (36.6 C) (Oral)   Resp 18   SpO2 97%  No data found.   Physical Exam  Constitutional: He appears well-developed and well-nourished.  HENT:  Head: Normocephalic and atraumatic.  Eyes: Conjunctivae and EOM are normal. Pupils are equal, round, and reactive to light.  Neck: Normal range of motion. Neck supple.  Cardiovascular: Normal rate, regular rhythm and normal  heart sounds.   Pulmonary/Chest: Effort normal and breath sounds normal.  Abdominal: Soft. Bowel sounds are normal.  Musculoskeletal: He exhibits tenderness.  Right 4th finger with erythema and swelling at DIP joint.  There is a black mark where he cut this 2 weeks ago.  Entire right 4th finger tip is erythematous and swollen. Unable to bend the finger at DIP joint and pain radiates down right 4th finger into right hand.  Pain level is high and exam is difficult.  Skin: There is erythema.  Erythema on right 4th finger tip  Nursing note and vitals reviewed.   Urgent Care Course     Procedures (including critical care time)  Labs Review Labs Reviewed - No data to display  Imaging Review Dg Finger Ring Left  Result Date: 09/15/2016 CLINICAL DATA:  Per pt: three weeks ago, cut the left ring finger. Cleaned and bandaged the left ring finger, removed and kept treating wound. Wound area is infected and swollen, feels like it is broken. EXAM: LEFT RING FINGER 2+V COMPARISON:  None. FINDINGS: There is no evidence of fracture or dislocation. There is no evidence of arthropathy or other focal bone abnormality. There is soft tissue swelling of the left fourth digit. IMPRESSION: 1.  No acute osseous injury of the left fourth digit. 2. Soft tissue swelling of the left fourth digit concerning for cellulitis. Electronically Signed   By: Elige KoHetal  Patel   On: 09/15/2016 12:18     Visual Acuity Review  Right Eye Distance:   Left Eye Distance:   Bilateral Distance:    Right Eye Near:   Left Eye Near:    Bilateral Near:         MDM  Right finger pain Right ring finger cellulitis  Explained no FB seen on XRAY and cellulitis seen.  Rocephin 1 gram IM Bactrim DS one po bid x 10 days #20 Oxycodone 10mg  one po tid prn #12  Recommend follow up with Ortho if pain persists and not better.       Deatra CanterWilliam J Declyn Delsol, FNP 09/15/16 1308

## 2016-09-15 NOTE — ED Triage Notes (Signed)
Pt reports he cut his left 4th digit w/a clean box cutter while working  Since then, his finger has been swelling progressively and getting painful  Last tetanus = 4 year ago.   A&O x4... nAD

## 2018-01-05 ENCOUNTER — Ambulatory Visit: Payer: Self-pay | Admitting: Family

## 2018-01-05 ENCOUNTER — Encounter: Payer: Self-pay | Admitting: Family

## 2018-01-05 VITALS — BP 128/80 | HR 66 | Temp 97.6°F | Ht 72.0 in | Wt 194.0 lb

## 2018-01-05 DIAGNOSIS — M7989 Other specified soft tissue disorders: Secondary | ICD-10-CM

## 2018-01-05 NOTE — Progress Notes (Signed)
David HeroMatthew Shelton is a 47 y.o. male with the following history as recorded in [employer]:  Patient Active Problem List   Diagnosis Date Noted  . Right leg numbness 10/29/2015  . Knee pain, chronic 10/29/2015  . Mass of finger of left hand 07/15/2015  . Numbness and tingling of right leg 05/08/2014  . 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 Mass of left hand 05/08/2014  . Right knee pain 08/14/2013  . Tobacco abuse 02/04/2013  . 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 Somnolence, daytime 10/11/2012  . Low TSH level 09/24/2012  . Other abnormal glucose 09/24/2012  . Migraine headache 07/03/2012  . DVT (deep venous thrombosis) (HCC) 05/15/2012  . OA (osteoarthritis) of ankle 05/15/2012    Current Outpatient Medications  Medication Sig Dispense Refill  . Oxycodone HCl 10 MG TABS Take 1 tablet (10 mg total) by mouth 3 (three) times daily as needed. May fill on or after 02/14/16 12 tablet 0   No current facility-administered medications for this visit.     Allergies: Patient has no known allergies.  Past Medical History:  Diagnosis Date  . Anemia   . Arthritis   . DVT (deep venous thrombosis) (HCC)   . Thyroid disease   . TMJ syndrome     Past Surgical History:  Procedure Laterality Date  . ARTHROSCOPIC REPAIR ACL    . BILATERAL TEMPOROMANDIBULAR JOINT ARTHROPLASTY      Family History  Problem Relation Age of Onset  . Arthritis Mother   . Hypertension Father   . Alcohol abuse Neg Hx   . Cancer Neg Hx   . Heart disease Neg Hx   . Hyperlipidemia Neg Hx   . Kidney disease Neg Hx   . Stroke Neg Hx   . Clotting disorder Neg Hx     Social History   Tobacco Use  . Smoking status: Current Every Day Smoker    Types: Cigarettes  . Smokeless tobacco: Never Used  Substance Use Topics  . Alcohol use: No    Subjective:  Patient has history of chronic right knee pain x "years." Notes that has been more painful in the past 2.5 months; in the past week, having  more pain with the knee and having to miss work as a result. Had knee surgery with [facility] Ophelia Charter[facility]ates [facility]; describes the pain as localized behind his right knee and radiating down into his leg; he notes there is no swelling today but that sometimes his calf can be as large as his thigh; denies any chest pain or shortness of breath; no known history of clotting disorder but was not able to complete the recommended work up for this when it was ordered 5 years ago.   Objective:  Vitals:   01/05/18 1540  BP: 128/80  Pulse: 66  Temp: 97.6 F (36.4 C)  TempSrc: Oral  SpO2: 97%  Weight: 194 lb (88 kg)  Height: 6' (1.829 m)    General: Well developed, well nourished, in no acute distress  Skin : Warm and dry.  Head: Normocephalic and atraumatic  Lungs: Respirations unlabored; clear to auscultation bilaterally without wheeze, rales, rhonchi  Musculoskeletal: No deformities; no active joint inflammation  Extremities: No edema, cyanosis, clubbing  Vessels: Symmetric bilaterally  Neurologic: Alert and oriented; speech intact; face symmetrical; moves all extremities well; CNII-XII intact without focal deficit  Assessment:  1. Right leg swelling     Plan:  Physical exam  is reassuring today; however, due to history of unprovoked DVT in 2013, will update venous doppler; patient is seen at 4 pm on Friday so am unable to get doppler until Monday; ER precautions discussed;  He mentioned that his right eye seemed more light sensitive in the past 2 months- recommend that he see an eye doctor for more complete exam;  Patient did ask for 1 month supply of his oxycodone as he was leaving the office today; in checking the [place] controlled substance database, could not find who has been prescribing for him- no prescription documented since 2017; + cocaine drug screen noted in 2016; opted against giving any type of narcotic today;   No follow-ups on file.  No orders of the  defined types were placed in this encounter.   Requested Prescriptions    No prescriptions requested or ordered in this encounter

## 2018-01-08 ENCOUNTER — Ambulatory Visit: Payer: Self-pay | Admitting: Family

## 2018-01-08 ENCOUNTER — Ambulatory Visit (HOSPITAL_COMMUNITY): Admission: RE | Admit: 2018-01-08 | Payer: Self-pay | Source: Ambulatory Visit | Attending: Family | Admitting: Family

## 2018-01-23 ENCOUNTER — Encounter (HOSPITAL_COMMUNITY): Payer: Self-pay | Admitting: Family
